# Patient Record
Sex: Female | Born: 1990 | Race: White | Hispanic: Yes | Marital: Single | Smoking: Current every day smoker
Health system: Southern US, Community
[De-identification: ages and names within clinical notes are randomized; demographics above are authoritative.]

## PROBLEM LIST (undated history)

## (undated) DIAGNOSIS — F419 Anxiety disorder, unspecified: Secondary | ICD-10-CM

## (undated) DIAGNOSIS — N750 Cyst of Bartholin's gland: Secondary | ICD-10-CM

## (undated) HISTORY — DX: Cyst of Bartholin's gland: N75.0

## (undated) HISTORY — PX: NO PAST SURGERIES: SHX2092

---

## 2009-05-11 ENCOUNTER — Ambulatory Visit: Payer: Self-pay | Admitting: Obstetrics and Gynecology

## 2009-05-11 LAB — CONVERTED CEMR LAB

## 2009-05-13 ENCOUNTER — Ambulatory Visit (HOSPITAL_COMMUNITY): Admission: RE | Admit: 2009-05-13 | Discharge: 2009-05-13 | Payer: Self-pay | Admitting: Family Medicine

## 2009-05-25 ENCOUNTER — Ambulatory Visit: Payer: Self-pay | Admitting: Obstetrics and Gynecology

## 2009-05-25 LAB — CONVERTED CEMR LAB
Chlamydia, DNA Probe: NEGATIVE
GC Probe Amp, Genital: NEGATIVE

## 2009-05-26 ENCOUNTER — Encounter: Payer: Self-pay | Admitting: Obstetrics and Gynecology

## 2009-06-01 ENCOUNTER — Ambulatory Visit: Payer: Self-pay | Admitting: Obstetrics and Gynecology

## 2009-06-08 ENCOUNTER — Ambulatory Visit: Payer: Self-pay | Admitting: Obstetrics and Gynecology

## 2009-06-15 ENCOUNTER — Ambulatory Visit: Payer: Self-pay | Admitting: Obstetrics and Gynecology

## 2009-06-22 ENCOUNTER — Ambulatory Visit: Payer: Self-pay | Admitting: Obstetrics and Gynecology

## 2009-06-22 LAB — CONVERTED CEMR LAB
ALT: 9 units/L (ref 0–35)
AST: 14 units/L (ref 0–37)
Albumin: 3.3 g/dL — ABNORMAL LOW (ref 3.5–5.2)
Alkaline Phosphatase: 148 units/L — ABNORMAL HIGH (ref 39–117)
Calcium: 9.3 mg/dL (ref 8.4–10.5)
Chloride: 107 meq/L (ref 96–112)
Glucose, Bld: 77 mg/dL (ref 70–99)
MCHC: 33.8 g/dL (ref 30.0–36.0)
Potassium: 3.6 meq/L (ref 3.5–5.3)
Total Protein: 6.3 g/dL (ref 6.0–8.3)
WBC: 9.7 10*3/uL (ref 4.0–10.5)

## 2009-06-23 ENCOUNTER — Inpatient Hospital Stay (HOSPITAL_COMMUNITY): Admission: AD | Admit: 2009-06-23 | Discharge: 2009-06-27 | Payer: Self-pay | Admitting: Obstetrics & Gynecology

## 2009-06-23 ENCOUNTER — Ambulatory Visit: Payer: Self-pay | Admitting: Advanced Practice Midwife

## 2009-06-23 ENCOUNTER — Ambulatory Visit: Payer: Self-pay | Admitting: Obstetrics and Gynecology

## 2009-06-28 ENCOUNTER — Ambulatory Visit: Payer: Self-pay | Admitting: Advanced Practice Midwife

## 2009-06-28 ENCOUNTER — Inpatient Hospital Stay (HOSPITAL_COMMUNITY): Admission: AD | Admit: 2009-06-28 | Discharge: 2009-06-28 | Payer: Self-pay | Admitting: Obstetrics & Gynecology

## 2009-07-01 ENCOUNTER — Ambulatory Visit: Payer: Self-pay | Admitting: Obstetrics & Gynecology

## 2009-07-03 ENCOUNTER — Ambulatory Visit: Payer: Self-pay | Admitting: Obstetrics and Gynecology

## 2009-07-03 ENCOUNTER — Inpatient Hospital Stay (HOSPITAL_COMMUNITY): Admission: AD | Admit: 2009-07-03 | Discharge: 2009-07-03 | Payer: Self-pay | Admitting: Obstetrics & Gynecology

## 2009-07-27 ENCOUNTER — Ambulatory Visit: Payer: Self-pay | Admitting: Obstetrics & Gynecology

## 2010-03-26 LAB — POCT URINALYSIS DIP (DEVICE)
Bilirubin Urine: NEGATIVE
Hgb urine dipstick: NEGATIVE
Ketones, ur: NEGATIVE mg/dL
Protein, ur: NEGATIVE mg/dL

## 2010-03-26 LAB — HERPES SIMPLEX VIRUS CULTURE

## 2010-03-26 LAB — URINE MICROSCOPIC-ADD ON

## 2010-03-26 LAB — CBC
HCT: 32.3 % — ABNORMAL LOW (ref 36.0–46.0)
HCT: 33 % — ABNORMAL LOW (ref 36.0–46.0)
HCT: 38.7 % (ref 36.0–46.0)
Hemoglobin: 12.5 g/dL (ref 12.0–15.0)
MCHC: 34.3 g/dL (ref 30.0–36.0)
MCHC: 34.5 g/dL (ref 30.0–36.0)
MCHC: 35 g/dL (ref 30.0–36.0)
MCV: 93.6 fL (ref 78.0–100.0)
MCV: 95 fL (ref 78.0–100.0)
Platelets: 270 10*3/uL (ref 150–400)
RBC: 2.99 MIL/uL — ABNORMAL LOW (ref 3.87–5.11)
RBC: 3.46 MIL/uL — ABNORMAL LOW (ref 3.87–5.11)
RDW: 12.9 % (ref 11.5–15.5)
WBC: 17.1 10*3/uL — ABNORMAL HIGH (ref 4.0–10.5)
WBC: 9.4 10*3/uL (ref 4.0–10.5)

## 2010-03-26 LAB — PROTEIN / CREATININE RATIO, URINE
Creatinine, Urine: 93.7 mg/dL
Creatinine, Urine: 93.8 mg/dL
Protein Creatinine Ratio: 0.21 — ABNORMAL HIGH (ref 0.00–0.15)
Total Protein, Urine: 20 mg/dL

## 2010-03-26 LAB — URINALYSIS, ROUTINE W REFLEX MICROSCOPIC
Bilirubin Urine: NEGATIVE
Protein, ur: 30 mg/dL — AB
Urobilinogen, UA: 0.2 mg/dL (ref 0.0–1.0)
pH: 6 (ref 5.0–8.0)

## 2010-03-26 LAB — COMPREHENSIVE METABOLIC PANEL
ALT: 9 U/L (ref 0–35)
AST: 25 U/L (ref 0–37)
Alkaline Phosphatase: 130 U/L — ABNORMAL HIGH (ref 39–117)
BUN: 10 mg/dL (ref 6–23)
Calcium: 9.4 mg/dL (ref 8.4–10.5)
GFR calc Af Amer: >60 mL/min (ref >60)
GFR calc non Af Amer: >60 mL/min (ref >60)
Glucose, Bld: 64 mg/dL — ABNORMAL LOW (ref 70–99)
Potassium: 3.3 mEq/L — ABNORMAL LOW (ref 3.5–5.1)
Total Bilirubin: 1.3 mg/dL — ABNORMAL HIGH (ref 0.3–1.2)
Total Protein: 5.9 g/dL — ABNORMAL LOW (ref 6.0–8.3)

## 2010-03-26 LAB — CULTURE, BLOOD (ROUTINE X 2): Culture: NO GROWTH

## 2010-03-26 LAB — WOUND CULTURE: Culture: NORMAL

## 2010-03-27 LAB — POCT URINALYSIS DIP (DEVICE)
Glucose, UA: NEGATIVE mg/dL
Glucose, UA: NEGATIVE mg/dL
Glucose, UA: NEGATIVE mg/dL
Ketones, ur: NEGATIVE mg/dL
Ketones, ur: NEGATIVE mg/dL
Nitrite: NEGATIVE
Nitrite: NEGATIVE
Protein, ur: NEGATIVE mg/dL
Protein, ur: NEGATIVE mg/dL
Specific Gravity, Urine: 1.01 (ref 1.005–1.030)
Specific Gravity, Urine: 1.01 (ref 1.005–1.030)
Specific Gravity, Urine: 1.015 (ref 1.005–1.030)
Urobilinogen, UA: 0.2 mg/dL (ref 0.0–1.0)
pH: 6.5 (ref 5.0–8.0)
pH: 7 (ref 5.0–8.0)

## 2010-03-28 LAB — POCT URINALYSIS DIP (DEVICE)
Ketones, ur: NEGATIVE mg/dL
Protein, ur: NEGATIVE mg/dL
Specific Gravity, Urine: 1.015 (ref 1.005–1.030)
pH: 6.5 (ref 5.0–8.0)

## 2011-01-30 ENCOUNTER — Encounter (HOSPITAL_COMMUNITY): Payer: Self-pay

## 2011-01-30 ENCOUNTER — Emergency Department (HOSPITAL_COMMUNITY)
Admission: EM | Admit: 2011-01-30 | Discharge: 2011-01-30 | Disposition: A | Discharge status: Home / Self Care | Payer: Self-pay | Attending: Emergency Medicine | Admitting: Emergency Medicine

## 2011-01-30 DIAGNOSIS — N751 Abscess of Bartholin's gland: Secondary | ICD-10-CM | POA: Insufficient documentation

## 2011-01-30 DIAGNOSIS — N898 Other specified noninflammatory disorders of vagina: Secondary | ICD-10-CM | POA: Insufficient documentation

## 2011-01-30 LAB — WET PREP, GENITAL: Yeast Wet Prep HPF POC: NONE SEEN

## 2011-01-30 MED ORDER — CEFIXIME 400 MG PO TABS: 400.0000 mg | ORAL_TABLET | Freq: Every day | ORAL | Status: AC

## 2011-01-30 MED ORDER — IBUPROFEN 800 MG PO TABS: 800.0000 mg | ORAL_TABLET | Freq: Three times a day (TID) | ORAL | Status: AC | PRN

## 2011-01-30 MED ORDER — LIDOCAINE HCL 2 % IJ SOLN
10.0000 mL | Freq: Once | INTRAMUSCULAR | Status: AC
  Administered 2011-01-30: 200 mg
  Filled 2011-01-30: qty 1

## 2011-01-30 NOTE — ED Notes (Signed)
Patient reports that she has raised area possible abcess at vagina. Denies pain, no drainage

## 2011-01-30 NOTE — ED Notes (Signed)
Pt given discharge info and rx, verb understanding, amb indep to discharge window.

## 2011-01-30 NOTE — ED Provider Notes (Signed)
Medical screening examination/treatment/procedure(s) were performed by non-physician practitioner and as supervising physician I was immediately available for consultation/collaboration.  Doug Sou, MD 01/30/11 [phone]

## 2011-01-30 NOTE — ED Provider Notes (Signed)
History     CSN: [id number]  Arrival date & time 01/30/11  1406   First MD Initiated Contact with Patient 01/30/11 1445      Chief Complaint  Patient presents with  . Cyst    pt states non tender ball protruding from vaginal canal    (Consider location/radiation/quality/duration/timing/severity/associated sxs/prior treatment) HPI Comments: Patient reports a "big red ball" protruding from her vagina x several months.  States it does not hurt except after sex.  Denies fevers, abdominal pain, abnormal vaginal discharge or bleeding, dysuria, urinary frequency, urgency, or incomplete emptying, change in bowel habits including diarrhea, hematochezia or melena.  Patient has IUD in place, LMP 18 months ago, prior to IUD placement.    The history is provided by the patient.    History reviewed. No pertinent past medical history.  History reviewed. No pertinent past surgical history.  No family history on file.  History  Substance Use Topics  . Smoking status: Current Everyday Smoker -- 0.5 packs/day  . Smokeless tobacco: Not on file  . Alcohol Use: No    OB History    Grav Para Term Preterm Abortions TAB SAB Ect Mult Living                  Review of Systems  Constitutional: Negative for fever.  Respiratory: Negative for shortness of breath.   Cardiovascular: Negative for chest pain.  Gastrointestinal: Negative for nausea, vomiting, abdominal pain, diarrhea, constipation, blood in stool, abdominal distention, anal bleeding and rectal pain.  Genitourinary: Negative for dysuria, urgency, frequency, hematuria, flank pain, decreased urine volume, vaginal bleeding, vaginal discharge, difficulty urinating, menstrual problem and pelvic pain.  All other systems reviewed and are negative.    Allergies  Review of patient's allergies indicates no known allergies.  Home Medications   Current Outpatient Rx  Name Route Sig Dispense Refill  . VITAMIN B-12 IJ Injection Inject 1  Syringe as directed once.    Marland Kitchen PHENTERMINE HCL 37.5 MG PO CAPS Oral Take 37.5 mg by mouth every morning.      BP 131/72  Pulse 81  Temp(Src) 98.9 F (37.2 C) (Oral)  Resp 16  Wt 197 lb (89.359 kg)  SpO2 100%  Physical Exam  Nursing note and vitals reviewed. Constitutional: She is oriented to person, place, and time. She appears well-developed and well-nourished.  HENT:  Head: Normocephalic and atraumatic.  Neck: Neck supple.  Cardiovascular: Normal rate and regular rhythm.   Pulmonary/Chest: Effort normal and breath sounds normal.  Abdominal: Soft. She exhibits no distension. There is no tenderness. There is no rebound and no guarding.  Genitourinary:    No tenderness around the vagina.       No rectocele or cystocele.  Small amount of white discharge.  Unable to fully visualize cervix due to large bartholin gland swelling.    Neurological: She is alert and oriented to person, place, and time.    ED Course  Procedures (including critical care time)  Labs Reviewed  WET PREP, GENITAL - Abnormal; Notable for the following:    Clue Cells, Wet Prep FEW (*)    WBC, Wet Prep HPF POC FEW (*)    All other components within normal limits  GC/CHLAMYDIA PROBE AMP, GENITAL   No results found.  Pelvic exam performed to further evaluate area of swelling, which patient thought was coming from her posterior vaginal wall - swelling actually likely bartholin's gland abscess - no cystocele or rectocele present.  Unable to fully complete  pelvic exam due to large swelling at introitus - vaginal mucosa is healthy-appearing.   INCISION AND DRAINAGE Performed by: Rise Patience Consent: Verbal consent obtained. Risks and benefits: risks, benefits and alternatives were discussed Type: abscess  Body area: right bartholin gland  Anesthesia: local infiltration  Local anesthetic: lidocaine 2% NO epinephrine  Anesthetic total: 3 ml  Complexity: complex Blunt dissection to break up  loculations  Drainage: purulent  Drainage amount: large amount of thin grey purulent fluid  Packing material: none - sac completely collapsed after I&D  Patient tolerance: Patient tolerated the procedure well with no immediate complications.    .  1. Bartholin's gland abscess       MDM  Patient with tender mass right introitus, c/w bartholin gland abscess.  I&D performed in ED with purulent drainage.  Sac drained down until completely deflated, did not place packing.  No surround erythema or induration.  No fevers.  As I was not able to pack area, patient placed on antibiotics and given instructions for warm soaks, [facility]  Pt verbalizes understanding and agrees with plan.           Dillard Cannon [place], [place] 01/30/11 [phone]

## 2011-01-31 LAB — GC/CHLAMYDIA PROBE AMP, GENITAL: GC Probe Amp, Genital: NEGATIVE

## 2011-04-09 DIAGNOSIS — N750 Cyst of Bartholin's gland: Secondary | ICD-10-CM

## 2011-04-09 HISTORY — DX: Cyst of Bartholin's gland: N75.0

## 2011-07-18 ENCOUNTER — Ambulatory Visit (INDEPENDENT_AMBULATORY_CARE_PROVIDER_SITE_OTHER): Payer: Self-pay | Admitting: Family Medicine

## 2011-07-18 ENCOUNTER — Encounter: Payer: Self-pay | Admitting: Family Medicine

## 2011-07-18 VITALS — BP 121/67 | HR 83 | Temp 98.4°F | Ht 66.0 in | Wt 184.0 lb

## 2011-07-18 DIAGNOSIS — N898 Other specified noninflammatory disorders of vagina: Secondary | ICD-10-CM | POA: Insufficient documentation

## 2011-07-18 DIAGNOSIS — M549 Dorsalgia, unspecified: Secondary | ICD-10-CM | POA: Insufficient documentation

## 2011-07-18 DIAGNOSIS — N750 Cyst of Bartholin's gland: Secondary | ICD-10-CM

## 2011-07-18 DIAGNOSIS — K089 Disorder of teeth and supporting structures, unspecified: Secondary | ICD-10-CM

## 2011-07-18 DIAGNOSIS — H547 Unspecified visual loss: Secondary | ICD-10-CM
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
## 2011-07-18 NOTE — Patient Instructions (Addendum)
Follow-up Friday or whenever it is convenient for you.

## 2011-07-20 ENCOUNTER — Ambulatory Visit (HOSPITAL_COMMUNITY)
Admission: RE | Admit: 2011-07-20 | Discharge: 2011-07-20 | Disposition: A | Discharge status: Home / Self Care | Payer: Self-pay | Source: Ambulatory Visit | Attending: Family Medicine | Admitting: Family Medicine

## 2011-07-20 ENCOUNTER — Ambulatory Visit (INDEPENDENT_AMBULATORY_CARE_PROVIDER_SITE_OTHER): Payer: Self-pay | Admitting: Family Medicine

## 2011-07-20 ENCOUNTER — Encounter: Payer: Self-pay | Admitting: Family Medicine

## 2011-07-20 VITALS — BP 110/70 | Ht 65.0 in | Wt 183.5 lb

## 2011-07-20 DIAGNOSIS — M545 Low back pain, unspecified: Secondary | ICD-10-CM | POA: Insufficient documentation

## 2011-07-20 DIAGNOSIS — M549 Dorsalgia, unspecified: Secondary | ICD-10-CM

## 2011-07-20 MED ORDER — CYCLOBENZAPRINE HCL 5 MG PO TABS: 5.0000 mg | ORAL_TABLET | Freq: Three times a day (TID) | ORAL | Status: DC | PRN

## 2011-07-20 NOTE — Patient Instructions (Addendum)
Go to [facility] and get x-rays of your back Follow-up in 1 week  Try the flexeril. 1 tablet in the morning and 1-2 tablets other times during the day as needed for spasms. Warm compresses Try ibuprofen 800 mg every 8 hours as needed for pain. Take with food to avoid nausea.

## 2011-07-20 NOTE — Assessment & Plan Note (Signed)
Will check x-rays of lumbar spine to evaluate for potential herniation, bony pathology.  For muscle spasm, flexeril and warm compresses.  If x-rays negative, consider referral to PT which patient found helpful in past.
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
## 2011-07-21 ENCOUNTER — Telehealth: Payer: Self-pay | Admitting: Family Medicine

## 2011-07-21 DIAGNOSIS — M62830 Muscle spasm of back: Secondary | ICD-10-CM

## 2011-07-21 NOTE — Telephone Encounter (Signed)
Discussed x-ray results with patient showing possible narrowing between L4-L5. She does not have signs of radiculopathy.  I think muscle spasms is contributing more to her discomfort than possible disc disease.  -Will continue muscle relaxants.  -Will refer to PT.  -Follow-up in 1 month (after PT) to re-evaluate.

## 2011-07-27 ENCOUNTER — Ambulatory Visit: Payer: Self-pay | Admitting: Family Medicine

## 2011-08-03 ENCOUNTER — Ambulatory Visit (INDEPENDENT_AMBULATORY_CARE_PROVIDER_SITE_OTHER): Payer: Self-pay | Admitting: Family Medicine

## 2011-08-03 VITALS — BP 109/70 | HR 90 | Temp 98.2°F | Ht 65.0 in | Wt 179.3 lb

## 2011-08-03 DIAGNOSIS — K029 Dental caries, unspecified: Secondary | ICD-10-CM | POA: Insufficient documentation

## 2011-08-03 DIAGNOSIS — M549 Dorsalgia, unspecified: Secondary | ICD-10-CM

## 2011-08-03 MED ORDER — METHOCARBAMOL 500 MG PO TABS: 1000.0000 mg | ORAL_TABLET | Freq: Three times a day (TID) | ORAL | Status: AC | PRN

## 2011-08-03 MED ORDER — MELOXICAM 15 MG PO TABS: 15.0000 mg | ORAL_TABLET | Freq: Every day | ORAL | Status: DC

## 2011-08-03 NOTE — Patient Instructions (Addendum)
Avenly,  Thank you for coming in today. For your back pain: 1. mobic daily 2. Robaxin 2 tab up to 3 x daily  3. PT   Please schedule an appt with Dr. Bluford Kaufmann park to discuss smoking cessation and Bartholin gland cyst  Dr. Armen Pickup

## 2011-08-03 NOTE — Assessment & Plan Note (Signed)
Cavities w/o abscess. Referral to dentist made.
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
## 2011-08-03 NOTE — Assessment & Plan Note (Signed)
A: chronic low back pain.  P: Change flexeril to robaxin.  Start mobic. Keep f/u with PT.

## 2011-08-06 ENCOUNTER — Ambulatory Visit: Payer: Self-pay | Attending: Family Medicine | Admitting: Physical Therapy

## 2011-08-06 DIAGNOSIS — IMO0001 Reserved for inherently not codable concepts without codable children: Secondary | ICD-10-CM | POA: Insufficient documentation

## 2011-08-06 DIAGNOSIS — M545 Low back pain, unspecified: Secondary | ICD-10-CM | POA: Insufficient documentation

## 2011-08-06 DIAGNOSIS — M2569 Stiffness of other specified joint, not elsewhere classified: Secondary | ICD-10-CM | POA: Insufficient documentation

## 2011-08-09 ENCOUNTER — Ambulatory Visit: Payer: Self-pay | Admitting: Physical Therapy

## 2011-08-14 ENCOUNTER — Ambulatory Visit: Payer: Self-pay | Attending: Family Medicine | Admitting: Physical Therapy

## 2011-08-14 DIAGNOSIS — M545 Low back pain, unspecified: Secondary | ICD-10-CM | POA: Insufficient documentation

## 2011-08-14 DIAGNOSIS — IMO0001 Reserved for inherently not codable concepts without codable children: Secondary | ICD-10-CM | POA: Insufficient documentation

## 2011-08-14 DIAGNOSIS — M2569 Stiffness of other specified joint, not elsewhere classified: Secondary | ICD-10-CM | POA: Insufficient documentation

## 2011-08-17 ENCOUNTER — Ambulatory Visit: Payer: Self-pay | Admitting: Physical Therapy

## 2011-08-21 ENCOUNTER — Ambulatory Visit: Payer: Self-pay | Admitting: Physical Therapy

## 2011-08-24 ENCOUNTER — Ambulatory Visit: Payer: Self-pay | Admitting: Physical Therapy

## 2011-08-30 ENCOUNTER — Ambulatory Visit (INDEPENDENT_AMBULATORY_CARE_PROVIDER_SITE_OTHER): Payer: Self-pay | Admitting: Obstetrics & Gynecology

## 2011-08-30 ENCOUNTER — Encounter: Payer: Self-pay | Admitting: Advanced Practice Midwife

## 2011-08-30 ENCOUNTER — Telehealth: Payer: Self-pay | Admitting: Family Medicine

## 2011-08-30 VITALS — BP 128/80 | HR 84 | Temp 99.5°F | Ht 65.0 in | Wt 179.4 lb

## 2011-08-30 DIAGNOSIS — N898 Other specified noninflammatory disorders of vagina: Secondary | ICD-10-CM

## 2011-08-30 NOTE — Telephone Encounter (Signed)
FYI to MD, notes in epic, appointment was scheduled for patient at [facility] prior to patient leaving GYN clinic, patient aware.

## 2011-08-30 NOTE — Progress Notes (Signed)
Patient ID: Amanda Villarreal, female   DOB: [birth year], 21 y.o.   MRN: [id number] Chief Complaint   Patient presents with   .  Cyst     pt states non tender ball protruding from vaginal canal   (Consider location/radiation/quality/duration/timing/severity/associated sxs/prior  treatment)  HPI Comments:Patient's last menstrual period was 08/29/2011. G2P1010 And an in and and Patient reports a "big red ball" protruding from her vagina x several months. States it does not hurt except after sex.I and D done at [facility] ED 01/30/11, sx recurred in 2 weeks  Denies fevers, abdominal pain, abnormal vaginal discharge or bleeding, dysuria, urinary frequency, urgency, or incomplete emptying, change in bowel habits including diarrhea, hematochezia or melena. Patient has IUD in place, LMP 821, light, IUD in place 24 mo  The history is provided by the patient.  History reviewed. No pertinent past medical history.  History reviewed. No pertinent past surgical history.  No family history on file.  History   Substance Use Topics   .  Smoking status:  Current Everyday Smoker -- 0.5 packs/day   .  Smokeless tobacco:  Not on file   .  Alcohol Use:  No    OB History    Grav  Para  Term  Preterm  Abortions  TAB  SAB  Ect  Mult  Living                 Review of Systems  Constitutional: Negative for fever.  Respiratory: Negative for shortness of breath.  Cardiovascular: Negative for chest pain.  Gastrointestinal: Negative for nausea, vomiting, abdominal pain, diarrhea, constipation, blood in stool, abdominal distention, anal bleeding and rectal pain.  Genitourinary: Negative for dysuria, urgency, frequency, hematuria, flank pain, decreased urine volume, vaginal bleeding, vaginal discharge, difficulty urinating, menstrual problem and pelvic pain.  All other systems reviewed and are negative.  Allergies   Review of patient's allergies indicates no known allergies.  Home Medications    Current Outpatient Rx   Name   Route  Sig  Dispense  Refill   .  VITAMIN B-12 IJ  Injection  Inject 1 Syringe as directed once.     Marland Kitchen  PHENTERMINE HCL 37.5 MG PO CAPS  Oral  Take 37.5 mg by mouth every morning.      Filed Vitals:   08/30/11 1305  BP: 128/80  Pulse: 84  Temp: 99.5 F (37.5 C)    Physical Exam  Nursing note and vitals reviewed.  Constitutional: She is oriented to person, place, and time. She appears well-developed and well-nourished.  HENT:  Head: Normocephalic and atraumatic.  Neck: Neck supple.  .  Pulmonary/Chest: Effort normal and breath sounds normal.  Abdominal: Soft. She exhibits no distension. There is no tenderness. There is no rebound and no guarding.  Genitourinary: 2 cm suburethral vaginal cyst visible immediately inferior to the meatus mildly tender. Pink mucosa, smooth Impression: vaginal wall cyst, suspicious for urethral involvement, not Bartholin's  Gland  Urology referral to assess for possible urethral diverticulum  Amanda Villarreal 2:12 PM 08/30/2011

## 2011-08-30 NOTE — Progress Notes (Signed)
Scheduled [facility] for September 24 @ 3pm.  Pt is aware of $250 payment at time of appt.

## 2011-08-30 NOTE — Telephone Encounter (Signed)
Patient is calling because she went to the GYN today and they have said she needs a referral to a Urologist.

## 2011-08-31 ENCOUNTER — Ambulatory Visit: Payer: Self-pay | Admitting: Physical Therapy

## 2011-09-11 ENCOUNTER — Encounter: Payer: Self-pay | Admitting: Family Medicine

## 2011-09-11 ENCOUNTER — Ambulatory Visit (INDEPENDENT_AMBULATORY_CARE_PROVIDER_SITE_OTHER): Payer: Self-pay | Admitting: Family Medicine

## 2011-09-11 VITALS — BP 121/83 | HR 92 | Temp 98.8°F | Ht 65.0 in | Wt 178.0 lb

## 2011-09-11 DIAGNOSIS — N898 Other specified noninflammatory disorders of vagina: Secondary | ICD-10-CM

## 2011-09-11 DIAGNOSIS — F411 Generalized anxiety disorder: Secondary | ICD-10-CM

## 2011-09-11 DIAGNOSIS — K029 Dental caries, unspecified: Secondary | ICD-10-CM

## 2011-09-11 DIAGNOSIS — M549 Dorsalgia, unspecified: Secondary | ICD-10-CM

## 2011-09-11 MED ORDER — PAROXETINE HCL 10 MG PO TABS
ORAL_TABLET | ORAL | Status: DC
Start: 2011-09-11 — End: 2011-10-24

## 2011-09-11 MED ORDER — ALPRAZOLAM 0.5 MG PO TBDP: 0.5000 mg | ORAL_TABLET | Freq: Two times a day (BID) | ORAL | Status: AC | PRN

## 2011-09-11 NOTE — Assessment & Plan Note (Signed)
-  Will try Effexor to help stabilize mood. Will give limited 2-weeks worth of alprazolam while she bridges to Effexor. It was made explicit this medication is only to be used short-term. She used it infrequently in the past when diagnosed with panic attacks at age 21. -I think she would benefit from CBT to learn techniques to deal with stressors. Given numbers for possible resources to try.  -Follow-up in 2 weeks.

## 2011-09-11 NOTE — Assessment & Plan Note (Signed)
Documentation only. Improved with physical therapy exercises.

## 2011-09-11 NOTE — Assessment & Plan Note (Signed)
Dr. Debroah Loop (obstetrician) who evaluated patient did not think cyst was due to Bartholin's gland. He is concerned about urethral diverticulum.  -Will refer to urology through [facility]

## 2011-09-11 NOTE — Progress Notes (Signed)
  Subjective:    Patient ID: Amanda Villarreal, female    DOB: [birth year], 21 y.o.   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 They improved but her anxiety is becoming worse and she is starting to have mild panic attacks again.  They happen during socially stressful situations, such as speaking in front of groups of people. The hair stands up on the back of her head and she becomes flushed.  She is also finding her mood to be increasing irritable, especially with her boyfriend.   # Back pain It is improving with physical therapy.   Review of Systems Per HPI. Denies depression  Allergies, medication, past medical history reviewed.      Objective:   Physical Exam GEN: NAD; well-nourished, -appearing PSYCH: appears anxious and with occasional pressured speech; engaged; appropriate to questions; does not appear depressed    Assessment & Plan:

## 2011-09-11 NOTE — Patient Instructions (Addendum)
For your anxiety: -Start Paxil, take as directed -Follow-up in 2 weeks -To help with anxiety during the first 2 weeks on Paxil, you may take alprazolam up to 1 tablet every 12 hours  I would recommend behavioral modification therapy Try calling the following clinics:   [facility] [place] [phone]  [facility] [phone]  [facility] [phone]   We will refer you to a urologist

## 2011-09-19 ENCOUNTER — Telehealth: Payer: Self-pay | Admitting: Family Medicine

## 2011-09-19 NOTE — Telephone Encounter (Signed)
Pt has an appt on 9/24 at [facility] set up by [facility].  Wants to know if this is covered by the orange card.  She tried to call there, but the person she needed to speak with was out so she called here to find out.

## 2011-09-19 NOTE — Telephone Encounter (Signed)
Contacted [employer] since we have not heard back from them, they state that yes it is covered they just have not sent out the info to our office yet.  Informed pt.  She will call back to let us know the date and time so that we can put it in the system. Caragh Gasper, Maryjo [place]

## 2011-09-26 ENCOUNTER — Other Ambulatory Visit (HOSPITAL_COMMUNITY): Payer: Self-pay | Admitting: Urology

## 2011-09-26 DIAGNOSIS — N361 Urethral diverticulum: Secondary | ICD-10-CM

## 2011-09-27 ENCOUNTER — Ambulatory Visit (HOSPITAL_COMMUNITY): Admission: RE | Admit: 2011-09-27 | Payer: Self-pay | Source: Ambulatory Visit

## 2011-09-29 ENCOUNTER — Ambulatory Visit (HOSPITAL_COMMUNITY)
Admission: RE | Admit: 2011-09-29 | Discharge: 2011-09-29 | Disposition: A | Discharge status: Home / Self Care | Payer: Self-pay | Source: Ambulatory Visit | Attending: Urology | Admitting: Urology

## 2011-09-29 DIAGNOSIS — N361 Urethral diverticulum: Secondary | ICD-10-CM

## 2011-09-29 DIAGNOSIS — N949 Unspecified condition associated with female genital organs and menstrual cycle: Secondary | ICD-10-CM | POA: Insufficient documentation

## 2011-09-29 DIAGNOSIS — N72 Inflammatory disease of cervix uteri: Secondary | ICD-10-CM | POA: Insufficient documentation

## 2011-09-29 MED ORDER — GADOBENATE DIMEGLUMINE 529 MG/ML IV SOLN
15.0000 mL | Freq: Once | INTRAVENOUS | Status: AC | PRN
  Administered 2011-09-29: 15 mL via INTRAVENOUS

## 2011-10-16 ENCOUNTER — Encounter: Payer: Self-pay | Admitting: Family Medicine

## 2011-10-16 ENCOUNTER — Other Ambulatory Visit: Payer: Self-pay | Admitting: Urology

## 2011-10-16 NOTE — Telephone Encounter (Signed)
error 

## 2011-10-17 NOTE — Telephone Encounter (Signed)
This encounter was created in error - please disregard.

## 2011-10-23 ENCOUNTER — Telehealth: Payer: Self-pay | Admitting: Family Medicine

## 2011-10-23 NOTE — Telephone Encounter (Signed)
Pt states that she is taking 2 per day of the Paxil

## 2011-10-23 NOTE — Telephone Encounter (Signed)
Patient asked to let me know how many Paxil 20 mg tablets she is taking now so refill request can be faxed.

## 2011-10-24 MED ORDER — PAROXETINE HCL 40 MG PO TABS: 40.0000 mg | ORAL_TABLET | ORAL | Status: DC

## 2011-10-24 NOTE — Telephone Encounter (Signed)
Rx faxed

## 2011-10-24 NOTE — Addendum Note (Signed)
Addended by: Priscella Mann J on: 10/24/2011 12:39 PM   Modules accepted: Orders

## 2011-10-31 ENCOUNTER — Encounter: Payer: Self-pay | Admitting: Family Medicine

## 2011-10-31 ENCOUNTER — Ambulatory Visit (INDEPENDENT_AMBULATORY_CARE_PROVIDER_SITE_OTHER): Payer: Self-pay | Admitting: Family Medicine

## 2011-10-31 VITALS — BP 119/76 | HR 70 | Temp 98.1°F | Ht 65.0 in | Wt 187.0 lb

## 2011-10-31 DIAGNOSIS — G4700 Insomnia, unspecified: Secondary | ICD-10-CM | POA: Insufficient documentation

## 2011-10-31 DIAGNOSIS — Z111 Encounter for screening for respiratory tuberculosis: Secondary | ICD-10-CM

## 2011-10-31 DIAGNOSIS — F411 Generalized anxiety disorder: Secondary | ICD-10-CM

## 2011-10-31 DIAGNOSIS — Z23 Encounter for immunization: Secondary | ICD-10-CM

## 2011-10-31 DIAGNOSIS — N898 Other specified noninflammatory disorders of vagina: Secondary | ICD-10-CM

## 2011-10-31 MED ORDER — MELATONIN 1 MG PO TABS: 0.5000 mg | ORAL_TABLET | Freq: Every evening | ORAL | Status: DC | PRN

## 2011-10-31 MED ORDER — PAROXETINE HCL 30 MG PO TABS: 30.0000 mg | ORAL_TABLET | ORAL | Status: DC

## 2011-10-31 NOTE — Assessment & Plan Note (Signed)
Try melatonin. Follow-up in 2 weeks if not helping. Anxiety symptoms seem to be contributing significantly.

## 2011-10-31 NOTE — Assessment & Plan Note (Signed)
Doing well on Paxil 30 mg (40 mg was too much). Continue. Follow-up in 3 months.

## 2011-10-31 NOTE — Patient Instructions (Addendum)
Try melatonin. 1/2 tablet before bedtime for a week. If you are still having difficulty sleeping, take 1 full tablet  Continue Paxil at 30 mg  Follow-up in 2 weeks if you are still having difficulty sleeping. If you are doing well, follow-up in 3 months.

## 2011-10-31 NOTE — Progress Notes (Signed)
  Subjective:    Patient ID: Amanda Villarreal, female    DOB: 1990/12/27, 21 y.o.   MRN: [id number]  HPI # GAD Well controlled with Paxil 30 mg It has been much easier to focus at school and she feels less anxious and more energetic during day after taking Paxil in the morning   # Insomnia She feels like she cannot relax This has not worsened after starting Paxil Lorazepam and alprazolam help but make her feel groggy in the morning  She reports good sleep hygiene (turns down everything at home), but ends up laying in bed for a couple of hours and then having to get up and move  Review of Systems Denies chest pain Denies depression  Allergies, medication, past medical history reviewed.      Objective:   Physical Exam GEN: NAD; well-appearing, -nourished; overweight PSYCH: appears mildly anxious; normal affect; smiling CV: RRR, normal S1/S2, no murmurs/gallops NEURO: intact    Assessment & Plan:

## 2011-10-31 NOTE — Assessment & Plan Note (Signed)
Documentation only. Seen by urologist who has her scheduled for surgery end of November. She is not sure what her diagnosis is.

## 2011-11-18 ENCOUNTER — Emergency Department (HOSPITAL_COMMUNITY)
Admission: EM | Admit: 2011-11-18 | Discharge: 2011-11-18 | Disposition: A | Discharge status: Home / Self Care | Payer: Self-pay | Attending: Emergency Medicine | Admitting: Emergency Medicine

## 2011-11-18 ENCOUNTER — Encounter (HOSPITAL_COMMUNITY): Payer: Self-pay | Admitting: *Deleted

## 2011-11-18 DIAGNOSIS — Z79899 Other long term (current) drug therapy: Secondary | ICD-10-CM | POA: Insufficient documentation

## 2011-11-18 DIAGNOSIS — K0889 Other specified disorders of teeth and supporting structures: Secondary | ICD-10-CM

## 2011-11-18 DIAGNOSIS — K089 Disorder of teeth and supporting structures, unspecified: Secondary | ICD-10-CM | POA: Insufficient documentation

## 2011-11-18 DIAGNOSIS — F17200 Nicotine dependence, unspecified, uncomplicated: Secondary | ICD-10-CM | POA: Insufficient documentation

## 2011-11-18 DIAGNOSIS — Z8742 Personal history of other diseases of the female genital tract: Secondary | ICD-10-CM | POA: Insufficient documentation

## 2011-11-18 MED ORDER — PENICILLIN V POTASSIUM 500 MG PO TABS: 500.0000 mg | ORAL_TABLET | Freq: Four times a day (QID) | ORAL | Status: DC

## 2011-11-18 MED ORDER — HYDROCODONE-ACETAMINOPHEN 5-325 MG PO TABS: 2.0000 | ORAL_TABLET | ORAL | Status: DC | PRN

## 2011-11-18 MED ORDER — OXYCODONE-ACETAMINOPHEN 5-325 MG PO TABS
2.0000 | ORAL_TABLET | Freq: Once | ORAL | Status: AC
  Administered 2011-11-18: 2 via ORAL
  Filled 2011-11-18: qty 2

## 2011-11-18 NOTE — ED Provider Notes (Signed)
Medical screening examination/treatment/procedure(s) were performed by non-physician practitioner and as supervising physician I was immediately available for consultation/collaboration.    Nelia Shi, MD 11/18/11 2129

## 2011-11-18 NOTE — ED Notes (Signed)
Patient given discharge instructions, information, prescriptions, and diet order. Patient states that they adequately understand discharge information given and to return to ED if symptoms return or worsen.     

## 2011-11-18 NOTE — ED Provider Notes (Signed)
History     CSN: [id number]  Arrival date & time 11/18/11  2006   First MD Initiated Contact with Patient 11/18/11 2036      Chief Complaint  Patient presents with  . Dental Pain    (Consider location/radiation/quality/duration/timing/severity/associated sxs/prior treatment) HPI Comments: This is a 21 year old female, who presents to the emergency department with chief complaint of dental pain x3 days. She states that she has been appointment to see her dentist on November 19, but cannot go any longer without pain medicine. She took a Vicodin from her mother this morning which helped, and she is requesting more pain medicine. She states the pain is 10 out of 10. She denies fever. Denies nausea or vomiting, denies shortness of breath and chest pain. She states that she has pain in her jaw. Her symptoms have been getting worse over the past several days.  The history is provided by the patient. No language interpreter was used.    Past Medical History  Diagnosis Date  . Bartholin's gland cyst 04/2011    History reviewed. No pertinent past surgical history.  Family History  Problem Relation Age of Onset  . Hypertension Mother   . Diabetes Mother   . Fibromyalgia Mother   . Arthritis Mother   . Nephrolithiasis Mother   . Lung cancer Maternal Grandfather     History  Substance Use Topics  . Smoking status: Current Every Day Smoker -- 0.5 packs/day for 9 years  . Smokeless tobacco: Not on file  . Alcohol Use: No    OB History    Grav Para Term Preterm Abortions TAB SAB Ect Mult Living   2 1 1  1            Review of Systems  All other systems reviewed and are negative.    Allergies  Review of patient's allergies indicates no known allergies.  Home Medications   Current Outpatient Rx  Name  Route  Sig  Dispense  Refill  . PAROXETINE HCL 30 MG PO TABS   Oral   Take 1 tablet (30 mg total) by mouth every morning.   30 tablet   3   . HYDROCODONE-ACETAMINOPHEN  5-325 MG PO TABS   Oral   Take 2 tablets by mouth every 4 (four) hours as needed for pain.   6 tablet   0   . PENICILLIN V POTASSIUM 500 MG PO TABS   Oral   Take 1 tablet (500 mg total) by mouth 4 (four) times daily.   40 tablet   0     BP 118/84  Pulse 65  Temp 98.4 F (36.9 C) (Oral)  Resp 20  Ht 5\' 5"  (1.651 m)  Wt 183 lb (83.008 kg)  BMI 30.45 kg/m2  SpO2 100%  LMP 10/28/2011  Physical Exam  Nursing note and vitals reviewed. Constitutional: She is oriented to person, place, and time. She appears well-developed and well-nourished.  HENT:  Head: Normocephalic and atraumatic.       Bottom left molar cracked, with surrounding erythema and swelling. Poor dentition throughout.  Eyes: Conjunctivae normal and EOM are normal. Pupils are equal, round, and reactive to light.  Neck: Normal range of motion. Neck supple.  Cardiovascular: Normal rate and regular rhythm.  Exam reveals no gallop and no friction rub.   No murmur heard. Pulmonary/Chest: Effort normal and breath sounds normal. No respiratory distress. She has no wheezes. She has no rales. She exhibits no tenderness.  Abdominal: Soft. Bowel sounds  are normal. She exhibits no distension and no mass. There is no tenderness. There is no rebound and no guarding.  Musculoskeletal: Normal range of motion. She exhibits no edema and no tenderness.  Neurological: She is alert and oriented to person, place, and time.  Skin: Skin is warm and dry.  Psychiatric: She has a normal mood and affect. Her behavior is normal. Judgment and thought content normal.    ED Course  Procedures (including critical care time)  Labs Reviewed - No data to display No results found.   1. Pain, dental       MDM  21 year old female with dental pain. I'm going to prescribe the patient Norco and penicillin. Loss manage the patient's pain with Percocet while in the emergency department. She is not driving home. Patient will followup with her  dentist tomorrow. She is agreeable with this plan. Advised warm compresses. Patient is stable and ready for discharge.        Roxy Horseman, PA-C 11/18/11 2125

## 2011-11-18 NOTE — ED Notes (Signed)
Pt from home, alert and oriented. Pt sts she has been dealing with a painful tooth for a while now and that three days ago the pain got much worse. Patient tried an OTC cap from [facility] with slight relief 3 days ago- pt unable to press cap into her tooth now because the pain is so great. Pt feels pain throughout her left cheek.

## 2011-11-20 ENCOUNTER — Telehealth: Payer: Self-pay | Admitting: Family Medicine

## 2011-11-20 MED ORDER — HYDROCODONE-ACETAMINOPHEN 5-325 MG PO TABS: 2.0000 | ORAL_TABLET | Freq: Two times a day (BID) | ORAL | Status: DC | PRN

## 2011-11-20 NOTE — Telephone Encounter (Signed)
Attempted call back and after  Dialing area code busy signal starts. Will try again later.

## 2011-11-20 NOTE — Telephone Encounter (Signed)
Spoke with patient and she has appointment with dentist on Thursday of this week . Went to ED and is on  Penicillin. Was given  just a few pain tabs .  Wants to know if Dr. Madolyn Frieze will prescribe pain medication now. Spoke with Dr. Madolyn Frieze and she will call patient .

## 2011-11-20 NOTE — Telephone Encounter (Addendum)
Attempted calling back.  Message left on voicemail to return call.

## 2011-11-20 NOTE — Telephone Encounter (Signed)
Patient is calling saying this is an emergency that she speak to Dr. Madolyn Frieze.  This is pertaining to the dental procedure and the extreme pain she is in.  It has gotten so bad that it looks like she has a golf ball size swelling in her mouth.  She would like something for pain.

## 2011-11-27 ENCOUNTER — Encounter (HOSPITAL_COMMUNITY): Payer: Self-pay | Admitting: Pharmacist

## 2011-11-30 ENCOUNTER — Encounter (HOSPITAL_COMMUNITY)
Admission: RE | Admit: 2011-11-30 | Discharge: 2011-11-30 | Disposition: A | Discharge status: Home / Self Care | Payer: Self-pay | Source: Ambulatory Visit | Attending: Urology | Admitting: Urology

## 2011-11-30 ENCOUNTER — Encounter (HOSPITAL_COMMUNITY): Payer: Self-pay

## 2011-11-30 ENCOUNTER — Ambulatory Visit (INDEPENDENT_AMBULATORY_CARE_PROVIDER_SITE_OTHER): Payer: Self-pay | Admitting: *Deleted

## 2011-11-30 DIAGNOSIS — Z111 Encounter for screening for respiratory tuberculosis: Secondary | ICD-10-CM

## 2011-11-30 DIAGNOSIS — Z23 Encounter for immunization: Secondary | ICD-10-CM

## 2011-11-30 HISTORY — DX: Anxiety disorder, unspecified: F41.9

## 2011-11-30 LAB — SURGICAL PCR SCREEN: MRSA, PCR: NEGATIVE

## 2011-11-30 LAB — CBC
HCT: 41.3 % (ref 36.0–46.0)
Hemoglobin: 14 g/dL (ref 12.0–15.0)
RBC: 4.46 MIL/uL (ref 3.87–5.11)
RDW: 12.4 % (ref 11.5–15.5)
WBC: 7.8 10*3/uL (ref 4.0–10.5)

## 2011-11-30 LAB — TYPE AND SCREEN: ABO/RH(D): A POS

## 2011-11-30 LAB — PROTIME-INR: INR: 1.03 (ref 0.00–1.49)

## 2011-11-30 LAB — APTT: aPTT: 29 seconds (ref 24–37)

## 2011-11-30 NOTE — Patient Instructions (Addendum)
18 Amanda Villarreal  11/30/2011   Your procedure is scheduled on: 12/04/11  Report to [facility] at 5:30AM.  Call this number if you have problems the morning of surgery [phone]: [phone]   Remember:   Do not eat food or drink liquids After Midnight.     Take these medicines the morning of surgery with A SIP OF WATER: Paxil   Do not wear jewelry, make-up or nail polish.  Do not wear lotions, powders, or perfumes. You may wear deodorant.  Do not shave 48 hours prior to surgery. Men may shave face and neck.  Do not bring valuables to the hospital.  Contacts, dentures or bridgework may not be worn into surgery.  Leave suitcase in the car. After surgery it may be brought to your room.  For patients admitted to the hospital, checkout time is 11:00 AM the day of discharge.     Special Instructions: Shower using CHG 2 nights before surgery and the night before surgery.  If you shower the day of surgery use CHG.  Use special wash - you have one bottle of CHG for all showers.  You should use approximately 1/3 of the bottle for each shower.   Please read over the following fact sheets that you were given: MRSA Information.  Birdie Sons, RN  pre op nurse call if needed [phone]

## 2011-12-03 MED ORDER — GENTAMICIN SULFATE 40 MG/ML IJ SOLN
320.0000 mg | INTRAMUSCULAR | Status: AC
  Administered 2011-12-04: 320 mg via INTRAVENOUS
  Filled 2011-12-03 (×2): qty 8

## 2011-12-03 NOTE — H&P (Signed)
History of Present Illness   Ms. 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 Her MRI supported this and I did not see an obvious urethral diverticulum.   She has stable frequency.   I discussed with her removal of the probable cyst versus a diverticulum.   I drew her a picture and we talked about reconstructive surgery in detail. Pros, cons, general surgical and anesthetic risks, and other options including watchful waiting were discussed. She understands that surgery is successful in approximately 85% of cases. Failure rates and the risk of persistence/recurrence/and worsening of the problem were discussed. Surgical risks were described but not limited to the discussion of injury to neighboring structures including the bowel (with possible life-threatening sepsis and colostomy), bladder, urethra, vagina (all resulting in further surgery), and ureter (resulting in re-implantation). We talked about injury to nerves/soft tissue leading to debilitating and intractable pelvic, abdominal, and lower extremity pain syndromes and neuropathies. The risks of dyspareunia, vaginal narrowing and shortening with sequelae were discussed. The risk of persistent, de novo, or worsening incontinence/dysfunction was discussed. Bleeding risks, transfusion rates, and infection were discussed. The usual post-operative course was described. The patient understands that she might not reach her treatment goal and that she might be worse following surgery.  She would like to proceed with surgery. If I cannot find a communication she probably will not need a catheter. I will schedule this for staying overnight but she may go home the same day.   I stressed complications including de novo stress incontinence, fistula with sequelae and recurrence. Review of systems: No other change in bowel or neurologic status.   Urinalysis: I reviewed, negative.   Amanda Villarreal has  had some bleeding with urination. I thought it best to reexamine her.   On repeat pelvic examination did not see any source for bleeding within the vagina or around the area of the cyst. There is no evidence of vaginitis or _____    Past Medical History Problems  1. History of  Anxiety (Symptom) 300.00  Surgical History Problems  1. History of  No Surgical Problems  Current Meds 1. ALPRAZolam 0.5 MG Oral Tablet; Therapy: (Recorded:18Sep2013) to 2. Flexeril 5 MG TABS; Therapy: (Recorded:18Sep2013) to 3. Paxil TABS; Therapy: (Recorded:18Sep2013) to  Allergies Medication  1. No Known Drug Allergies  Family History Problems  1. Paternal history of  Death In The Family Father father passed @ age 54brain anerysm 2. Maternal history of  Nephrolithiasis  Social History Problems  1. Alcohol Use 1-2 drinks a week 2. Caffeine Use 2 drinks daily 3. Marital History - Single 4. Occupation: Consulting civil engineer 5. Tobacco Use 305.1 smokes 1/2 pack dailysmoking for 8 years  Results/Data     Assessment Assessed  1. Adenitis Of Skene's Gland [phone]  Plan   Discussion/Summary   Amanda Villarreal would like to proceed with excision of the Skene's duct cyst.  After a thorough review of the management options for the patient's condition the patient  elected to proceed with surgical therapy as noted above. We have discussed the potential benefits and risks of the procedure, side effects of the proposed treatment, the likelihood of the patient achieving the goals of the procedure, and any potential problems that might occur during the procedure or recuperation. Informed consent has been obtained.

## 2011-12-04 ENCOUNTER — Encounter (HOSPITAL_COMMUNITY)
Admission: RE | Disposition: A | Discharge status: Home / Self Care | Payer: Self-pay | Source: Ambulatory Visit | Attending: Urology

## 2011-12-04 ENCOUNTER — Ambulatory Visit (HOSPITAL_COMMUNITY): Payer: Self-pay | Admitting: Anesthesiology

## 2011-12-04 ENCOUNTER — Ambulatory Visit (HOSPITAL_COMMUNITY)
Admission: RE | Admit: 2011-12-04 | Discharge: 2011-12-04 | Disposition: A | Discharge status: Home / Self Care | Payer: Self-pay | Source: Ambulatory Visit | Attending: Urology | Admitting: Urology

## 2011-12-04 ENCOUNTER — Encounter (HOSPITAL_COMMUNITY): Payer: Self-pay | Admitting: *Deleted

## 2011-12-04 ENCOUNTER — Encounter (HOSPITAL_COMMUNITY): Payer: Self-pay | Admitting: Anesthesiology

## 2011-12-04 DIAGNOSIS — F411 Generalized anxiety disorder: Secondary | ICD-10-CM | POA: Insufficient documentation

## 2011-12-04 DIAGNOSIS — N361 Urethral diverticulum: Secondary | ICD-10-CM | POA: Insufficient documentation

## 2011-12-04 DIAGNOSIS — N398 Other specified disorders of urinary system: Secondary | ICD-10-CM | POA: Insufficient documentation

## 2011-12-04 DIAGNOSIS — Z841 Family history of disorders of kidney and ureter: Secondary | ICD-10-CM | POA: Insufficient documentation

## 2011-12-04 DIAGNOSIS — F17200 Nicotine dependence, unspecified, uncomplicated: Secondary | ICD-10-CM | POA: Insufficient documentation

## 2011-12-04 HISTORY — PX: URETHRAL DIVERTICULUM REPAIR: SHX5148

## 2011-12-04 HISTORY — PX: EAR CYST EXCISION: SHX22

## 2011-12-04 HISTORY — PX: CYSTOSCOPY: SHX5120

## 2011-12-04 SURGERY — URETHROPLASTY, FOR DIVERTICULUM
Anesthesia: General | Wound class: Clean Contaminated

## 2011-12-04 MED ORDER — ESTRADIOL 0.1 MG/GM VA CREA
TOPICAL_CREAM | VAGINAL | Status: DC | PRN
  Administered 2011-12-04: 1 via VAGINAL

## 2011-12-04 MED ORDER — HYDROMORPHONE HCL PF 1 MG/ML IJ SOLN
INTRAMUSCULAR | Status: AC
  Filled 2011-12-04: qty 1

## 2011-12-04 MED ORDER — OXYCODONE HCL 5 MG/5ML PO SOLN: 5.0000 mg | Freq: Once | ORAL | Status: AC | PRN

## 2011-12-04 MED ORDER — GLYCOPYRROLATE 0.2 MG/ML IJ SOLN
INTRAMUSCULAR | Status: DC | PRN
  Administered 2011-12-04: 0.6 mg via INTRAVENOUS

## 2011-12-04 MED ORDER — LIDOCAINE-EPINEPHRINE (PF) 1 %-1:200000 IJ SOLN
INTRAMUSCULAR | Status: AC
  Filled 2011-12-04: qty 10

## 2011-12-04 MED ORDER — HYDROCODONE-ACETAMINOPHEN 5-325 MG PO TABS: 1.0000 | ORAL_TABLET | Freq: Four times a day (QID) | ORAL | Status: DC | PRN

## 2011-12-04 MED ORDER — ESTRADIOL 0.1 MG/GM VA CREA
TOPICAL_CREAM | VAGINAL | Status: AC
  Filled 2011-12-04: qty 85

## 2011-12-04 MED ORDER — FENTANYL CITRATE 0.05 MG/ML IJ SOLN
INTRAMUSCULAR | Status: DC | PRN
  Administered 2011-12-04 (×4): 50 ug via INTRAVENOUS

## 2011-12-04 MED ORDER — ACETAMINOPHEN 10 MG/ML IV SOLN
INTRAVENOUS | Status: AC
  Filled 2011-12-04: qty 100

## 2011-12-04 MED ORDER — ONDANSETRON HCL 4 MG/2ML IJ SOLN
INTRAMUSCULAR | Status: DC | PRN
  Administered 2011-12-04: 4 mg via INTRAVENOUS

## 2011-12-04 MED ORDER — ACETAMINOPHEN 10 MG/ML IV SOLN: 1000.0000 mg | Freq: Once | INTRAVENOUS | Status: DC | PRN

## 2011-12-04 MED ORDER — HYDROMORPHONE HCL PF 1 MG/ML IJ SOLN
0.2500 mg | INTRAMUSCULAR | Status: DC | PRN
  Administered 2011-12-04 (×3): 0.5 mg via INTRAVENOUS

## 2011-12-04 MED ORDER — LACTATED RINGERS IV SOLN
INTRAVENOUS | Status: DC | PRN
  Administered 2011-12-04 (×2): via INTRAVENOUS

## 2011-12-04 MED ORDER — SUCCINYLCHOLINE CHLORIDE 20 MG/ML IJ SOLN
INTRAMUSCULAR | Status: DC | PRN
  Administered 2011-12-04: 100 mg via INTRAVENOUS

## 2011-12-04 MED ORDER — PROPOFOL 10 MG/ML IV BOLUS
INTRAVENOUS | Status: DC | PRN
  Administered 2011-12-04: 160 mg via INTRAVENOUS

## 2011-12-04 MED ORDER — STERILE WATER FOR IRRIGATION IR SOLN
Status: DC | PRN
  Administered 2011-12-04: 3000 mL

## 2011-12-04 MED ORDER — ROCURONIUM BROMIDE 100 MG/10ML IV SOLN
INTRAVENOUS | Status: DC | PRN
  Administered 2011-12-04 (×2): 10 mg via INTRAVENOUS
  Administered 2011-12-04: 20 mg via INTRAVENOUS

## 2011-12-04 MED ORDER — OXYCODONE HCL 5 MG PO TABS
5.0000 mg | ORAL_TABLET | Freq: Once | ORAL | Status: AC | PRN
  Administered 2011-12-04: 5 mg via ORAL
  Filled 2011-12-04: qty 1

## 2011-12-04 MED ORDER — SODIUM CHLORIDE 0.9 % IV SOLN
1.5000 g | INTRAVENOUS | Status: AC
  Administered 2011-12-04: 1.5 g via INTRAVENOUS

## 2011-12-04 MED ORDER — LIDOCAINE HCL (CARDIAC) 20 MG/ML IV SOLN
INTRAVENOUS | Status: DC | PRN
  Administered 2011-12-04: 50 mg via INTRAVENOUS

## 2011-12-04 MED ORDER — DEXAMETHASONE SODIUM PHOSPHATE 10 MG/ML IJ SOLN
INTRAMUSCULAR | Status: DC | PRN
  Administered 2011-12-04: 10 mg via INTRAVENOUS

## 2011-12-04 MED ORDER — SODIUM CHLORIDE 0.9 % IR SOLN
Status: DC | PRN
  Administered 2011-12-04: 09:00:00

## 2011-12-04 MED ORDER — ACETAMINOPHEN 10 MG/ML IV SOLN
INTRAVENOUS | Status: DC | PRN
  Administered 2011-12-04: 1000 mg via INTRAVENOUS

## 2011-12-04 MED ORDER — PROMETHAZINE HCL 25 MG/ML IJ SOLN: 6.2500 mg | INTRAMUSCULAR | Status: DC | PRN

## 2011-12-04 MED ORDER — MIDAZOLAM HCL 5 MG/5ML IJ SOLN
INTRAMUSCULAR | Status: DC | PRN
  Administered 2011-12-04: 2 mg via INTRAVENOUS

## 2011-12-04 MED ORDER — SODIUM CHLORIDE 0.9 % IV SOLN
INTRAVENOUS | Status: AC
  Filled 2011-12-04: qty 1.5

## 2011-12-04 MED ORDER — CEPHALEXIN 500 MG PO CAPS: 500.0000 mg | ORAL_CAPSULE | Freq: Three times a day (TID) | ORAL | Status: DC

## 2011-12-04 MED ORDER — MEPERIDINE HCL 50 MG/ML IJ SOLN: 6.2500 mg | INTRAMUSCULAR | Status: DC | PRN

## 2011-12-04 MED ORDER — INDIGOTINDISULFONATE SODIUM 8 MG/ML IJ SOLN
INTRAMUSCULAR | Status: AC
  Filled 2011-12-04: qty 5

## 2011-12-04 MED ORDER — INDIGOTINDISULFONATE SODIUM 8 MG/ML IJ SOLN
INTRAMUSCULAR | Status: DC | PRN
  Administered 2011-12-04: 5 mL

## 2011-12-04 MED ORDER — NEOSTIGMINE METHYLSULFATE 1 MG/ML IJ SOLN
INTRAMUSCULAR | Status: DC | PRN
  Administered 2011-12-04: 4 mg via INTRAVENOUS

## 2011-12-04 SURGICAL SUPPLY — 53 items
BAG URINE DRAINAGE (UROLOGICAL SUPPLIES) ×2 IMPLANT
BAG URO CATCHER STRL LF (DRAPE) ×2 IMPLANT
BANDAGE COBAN STERILE 2 (GAUZE/BANDAGES/DRESSINGS) IMPLANT
BANDAGE GAUZE ELAST BULKY 4 IN (GAUZE/BANDAGES/DRESSINGS) IMPLANT
BLADE HEX COATED 2.75 (ELECTRODE) ×2 IMPLANT
BLADE SURG 15 STRL LF DISP TIS (BLADE) ×2 IMPLANT
BLADE SURG 15 STRL SS (BLADE) ×2
BLADE SURG SZ10 CARB STEEL (BLADE) ×2 IMPLANT
BRIEF STRETCH FOR OB PAD LRG (UNDERPADS AND DIAPERS) IMPLANT
CATH FOLEY 2WAY SLVR  5CC 14FR (CATHETERS) ×1
CATH FOLEY 2WAY SLVR  5CC 18FR (CATHETERS)
CATH FOLEY 2WAY SLVR 5CC 14FR (CATHETERS) ×1 IMPLANT
CATH FOLEY 2WAY SLVR 5CC 18FR (CATHETERS) IMPLANT
CATH INTERMIT  6FR 70CM (CATHETERS) IMPLANT
CATH ROBINSON RED A/P 16FR (CATHETERS) IMPLANT
CATH ROBINSON RED A/P 18FR (CATHETERS) IMPLANT
CLOTH BEACON ORANGE TIMEOUT ST (SAFETY) ×2 IMPLANT
DRAIN PENROSE 18X1/4 LTX STRL (WOUND CARE) ×2 IMPLANT
DRAPE CAMERA CLOSED 9X96 (DRAPES) IMPLANT
DRESSING TELFA 8X3 (GAUZE/BANDAGES/DRESSINGS) IMPLANT
GAUZE PACKING 2X5 YD STERILE (GAUZE/BANDAGES/DRESSINGS) IMPLANT
GAUZE SPONGE 4X4 16PLY XRAY LF (GAUZE/BANDAGES/DRESSINGS) ×4 IMPLANT
GLOVE BIOGEL M STRL SZ7.5 (GLOVE) ×6 IMPLANT
GOWN PREVENTION PLUS XLARGE (GOWN DISPOSABLE) IMPLANT
GOWN STRL REIN XL XLG (GOWN DISPOSABLE) ×4 IMPLANT
HOLDER FOLEY CATH W/STRAP (MISCELLANEOUS) ×2 IMPLANT
KIT BASIN OR (CUSTOM PROCEDURE TRAY) ×2 IMPLANT
NEEDLE HYPO 25X1 1.5 SAFETY (NEEDLE) ×2 IMPLANT
NEEDLE INJECT RIGID (NEEDLE) IMPLANT
PACK CYSTO (CUSTOM PROCEDURE TRAY) ×2 IMPLANT
PAK SCROTO (SET/KITS/TRAYS/PACK) IMPLANT
PENCIL BUTTON HOLSTER BLD 10FT (ELECTRODE) ×2 IMPLANT
PLUG CATH AND CAP STER (CATHETERS) ×2 IMPLANT
RETRACTOR STAY HOOK 5MM (MISCELLANEOUS) ×2 IMPLANT
SHEET LAVH (DRAPES) ×2 IMPLANT
SPONGE LAP 4X18 X RAY DECT (DISPOSABLE) IMPLANT
STRIP CLOSURE SKIN 1/2X4 (GAUZE/BANDAGES/DRESSINGS) IMPLANT
SUT CHROMIC 3 0 SH 27 (SUTURE) IMPLANT
SUT SILK 2 0 30  PSL (SUTURE) ×1
SUT SILK 2 0 30 PSL (SUTURE) ×1 IMPLANT
SUT SILK 4 0 SH CR/8 (SUTURE) IMPLANT
SUT VIC AB 2-0 SH 27 (SUTURE) ×2
SUT VIC AB 2-0 SH 27X BRD (SUTURE) ×2 IMPLANT
SUT VIC AB 3-0 SH 27 (SUTURE) ×2
SUT VIC AB 3-0 SH 27XBRD (SUTURE) ×2 IMPLANT
SUT VIC AB 4-0 RB1 27 (SUTURE) ×3
SUT VIC AB 4-0 RB1 27XBRD (SUTURE) ×3 IMPLANT
SUT VIC AB 4-0 SH 27 (SUTURE)
SUT VIC AB 4-0 SH 27XBRD (SUTURE) IMPLANT
SYRINGE 10CC LL (SYRINGE) ×2 IMPLANT
TUBING CONNECTING 10 (TUBING) ×2 IMPLANT
WATER STERILE IRR 1500ML POUR (IV SOLUTION) IMPLANT
YANKAUER SUCT BULB TIP 10FT TU (MISCELLANEOUS) ×2 IMPLANT

## 2011-12-04 NOTE — Progress Notes (Signed)
Dr. Sherron Monday in to see patient prior to discharge.

## 2011-12-04 NOTE — Anesthesia Preprocedure Evaluation (Addendum)
Anesthesia Evaluation  Patient identified by MRN, date of birth, ID band Patient awake    Reviewed: Allergy & Precautions, H&P , NPO status , Patient's Chart, lab work & pertinent test results  Airway       Dental  (+) Dental Advisory Given   Pulmonary Current Smoker,          Cardiovascular - CAD and - Past MI     Neuro/Psych PSYCHIATRIC DISORDERS Anxiety negative neurological ROS     GI/Hepatic negative GI ROS, Neg liver ROS,   Endo/Other  negative endocrine ROS  Renal/GU negative Renal ROS     Musculoskeletal negative musculoskeletal ROS (+)   Abdominal   Peds  Hematology negative hematology ROS (+)   Anesthesia Other Findings   Reproductive/Obstetrics                          Anesthesia Physical Anesthesia Plan  ASA: II  Anesthesia Plan: General   Post-op Pain Management:    Induction: Intravenous  Airway Management Planned: Oral ETT  Additional Equipment:   Intra-op Plan:   Post-operative Plan: Extubation in OR  Informed Consent: I have reviewed the patients History and Physical, chart, labs and discussed the procedure including the risks, benefits and alternatives for the proposed anesthesia with the patient or authorized representative who has indicated his/her understanding and acceptance.   Dental advisory given  Plan Discussed with: CRNA  Anesthesia Plan Comments:       Anesthesia Quick Evaluation

## 2011-12-04 NOTE — H&P (Signed)
Preoperative diagnosis: Urethral diverticulum or Skene's duct cyst Postoperative diagnosis: Skene's duct cyst Surgery: Excision of Skene's duct cyst and cystoscopy Surgeon: Dr. Lorin Picket Kaedance Magos Assistant Pecola Leisure  The patient consented above procedure. Extra care was taken for leg positioning to minimize the risk of compartment syndrome and deep vein thrombosis and neuropathy. Preoperative antibiotics were given. Preoperative laboratory tests were normal  The patient had an obvious Skene's duct cyst approximately 2.5 x 2 cm in size extending from the midline to the patient's right side. I use my usual retractors and double-ring retractor to expose the cyst. I made a midline incision after marking it with a blue marking pen. The cyst was opened and yellow fluid was expressed. It was cultured. It likely represented a sterile abscess  The vaginal area and cystoscopy were irrigated copiously. Even with the cyst decompressed it was quite easy to excise at from its overlying vaginal wall mucosa and soft tissue. I had to stay away from the urethra at 2:00. She'll large capacious urethral meatus. The cystoscopy was removed almost in its entirety. There was a lot of irrigation. I fulgurated the very small roof strip.  I inserted methylene blue at 500 cc in the bladder. I did my usual compression technique and she did not have an opening from the cyst to the distal urethra  I closed the soft tissue with interrupted 3-0 Vicryl working from in to out closing the dead space. I closed the vaginal mucosa after trimming it minimally with interrupted 4-0 Vicryl. I rebuilt the urethral meatus nicely.  Vaginal pack with Estrace was inserted. Foley catheter was left in place. The patient be sent home today without a catheter

## 2011-12-04 NOTE — Anesthesia Postprocedure Evaluation (Signed)
Anesthesia Post Note  Patient: Amanda Villarreal  Procedure(s) Performed: Procedure(s) (LRB): URETHRAL DIVERTICULUM REPAIR (N/A) CYSTOSCOPY (N/A) CYST REMOVAL (N/A)  Anesthesia type: General  Patient location: PACU  Post pain: Pain level controlled  Post assessment: Post-op Vital signs reviewed  Last Vitals: BP 96/58  Pulse 52  Temp 36.8 C (Oral)  Resp 13  SpO2 96%  Post vital signs: Reviewed  Level of consciousness: sedated  Complications: No apparent anesthesia complications

## 2011-12-04 NOTE — Transfer of Care (Signed)
Immediate Anesthesia Transfer of Care Note  Patient: Amanda Villarreal  Procedure(s) Performed: Procedure(s) (LRB) with comments: URETHRAL DIVERTICULUM REPAIR (N/A) CYSTOSCOPY (N/A) CYST REMOVAL (N/A)  Patient Location: PACU  Anesthesia Type:General  Level of Consciousness: awake  Airway & Oxygen Therapy: Patient Spontanous Breathing and Patient connected to face mask oxygen  Post-op Assessment: Report given to PACU RN and Post -op Vital signs reviewed and stable  Post vital signs: Reviewed and stable  Complications: No apparent anesthesia complications

## 2011-12-04 NOTE — Interval H&P Note (Signed)
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 After consideration of risks, benefits and other options for treatment, the patient has consented to  Procedure(s) (LRB) with comments: URETHRAL DIVERTICULUM REPAIR (N/A) CYSTOSCOPY (N/A) CYST REMOVAL (N/A) as a surgical intervention .  The patient's history has been reviewed, patient examined, no change in status, stable for surgery.  I have reviewed the patient's chart and labs.  Questions were answered to the patient's satisfaction.     Janaia Kozel A

## 2011-12-05 ENCOUNTER — Encounter (HOSPITAL_COMMUNITY): Payer: Self-pay | Admitting: Urology

## 2011-12-07 LAB — CULTURE, ROUTINE-ABSCESS: Gram Stain: NONE SEEN

## 2011-12-09 LAB — ANAEROBIC CULTURE: Gram Stain: NONE SEEN

## 2011-12-26 NOTE — Op Note (Signed)
Preoperative diagnosis: Urethral diverticulum or Skene's duct cyst  Postoperative diagnosis: Skene's duct cyst  Surgery: Excision of Skene's duct cyst and cystoscopy  Surgeon: Dr. Lorin Picket Avo Schlachter  Assistant Pecola Leisure  The patient consented above procedure. Extra care was taken for leg positioning to minimize the risk of compartment syndrome and deep vein thrombosis and neuropathy. Preoperative antibiotics were given. Preoperative laboratory tests were normal  The patient had an obvious Skene's duct cyst approximately 2.5 x 2 cm in size extending from the midline to the patient's right side. I use my usual retractors and double-ring retractor to expose the cyst. I made a midline incision after marking it with a blue marking pen. The cyst was opened and yellow fluid was expressed. It was cultured. It likely represented a sterile abscess  The vaginal area and cystoscopy were irrigated copiously. Even with the cyst decompressed it was quite easy to excise at from its overlying vaginal wall mucosa and soft tissue. I had to stay away from the urethra at 2:00. She'll large capacious urethral meatus. The cystoscopy was removed almost in its entirety. There was a lot of irrigation. I fulgurated the very small roof strip.  I inserted methylene blue at 500 cc in the bladder. I did my usual compression technique and she did not have an opening from the cyst to the distal urethra  I closed the soft tissue with interrupted 3-0 Vicryl working from in to out closing the dead space. I closed the vaginal mucosa after trimming it minimally with interrupted 4-0 Vicryl. I rebuilt the urethral meatus nicely.  Vaginal pack with Estrace was inserted. Foley catheter was left in place. The patient be sent home today without a catheter  Routing History.Marland KitchenMarland Kitchen

## 2012-02-01 ENCOUNTER — Telehealth: Payer: Self-pay | Admitting: Family Medicine

## 2012-02-01 NOTE — Telephone Encounter (Signed)
Pt just started at [facility] and needs [place] to fax over her shot record - she had hep b series last year and flu shot  Fax to Employee health screening - [phone]

## 2012-02-01 NOTE — Telephone Encounter (Signed)
Records faxed, confirmed receipt.

## 2012-02-15 ENCOUNTER — Telehealth: Payer: Self-pay | Admitting: Family Medicine

## 2012-02-15 NOTE — Telephone Encounter (Signed)
Received after hours call from patient.  She says she has a tooth infection.  This has happened before, and she does not want to go to the ER when all she needs is antibiotics.  She works at [employer] and has to be there on Monday. I let her know that I cannot prescribe antibiotics over the phone, she has to be seen.  I recommended going to urgent care before they close tonight or tomorrow morning for evaluation.  She voices understanding.   Amanda Villarreal 02/15/2012 7:17 PM

## 2012-08-31 ENCOUNTER — Emergency Department (HOSPITAL_COMMUNITY): Payer: Self-pay

## 2012-08-31 ENCOUNTER — Emergency Department (HOSPITAL_COMMUNITY)
Admission: EM | Admit: 2012-08-31 | Discharge: 2012-08-31 | Disposition: A | Discharge status: Home / Self Care | Payer: Self-pay | Attending: Emergency Medicine | Admitting: Emergency Medicine

## 2012-08-31 ENCOUNTER — Encounter (HOSPITAL_COMMUNITY): Payer: Self-pay | Admitting: *Deleted

## 2012-08-31 DIAGNOSIS — R05 Cough: Secondary | ICD-10-CM | POA: Insufficient documentation

## 2012-08-31 DIAGNOSIS — R000 Tachycardia, unspecified: Secondary | ICD-10-CM | POA: Insufficient documentation

## 2012-08-31 DIAGNOSIS — R509 Fever, unspecified: Secondary | ICD-10-CM | POA: Insufficient documentation

## 2012-08-31 DIAGNOSIS — R062 Wheezing: Secondary | ICD-10-CM | POA: Diagnosis present

## 2012-08-31 DIAGNOSIS — F17200 Nicotine dependence, unspecified, uncomplicated: Secondary | ICD-10-CM | POA: Insufficient documentation

## 2012-08-31 DIAGNOSIS — R059 Cough, unspecified: Secondary | ICD-10-CM | POA: Insufficient documentation

## 2012-08-31 DIAGNOSIS — Z8659 Personal history of other mental and behavioral disorders: Secondary | ICD-10-CM | POA: Insufficient documentation

## 2012-08-31 DIAGNOSIS — Z8742 Personal history of other diseases of the female genital tract: Secondary | ICD-10-CM | POA: Insufficient documentation

## 2012-08-31 DIAGNOSIS — J069 Acute upper respiratory infection, unspecified: Secondary | ICD-10-CM | POA: Diagnosis present

## 2012-08-31 MED ORDER — ALBUTEROL SULFATE HFA 108 (90 BASE) MCG/ACT IN AERS
6.0000 | INHALATION_SPRAY | Freq: Once | RESPIRATORY_TRACT | Status: AC
  Administered 2012-08-31: 6 via RESPIRATORY_TRACT
  Filled 2012-08-31: qty 6.7

## 2012-08-31 NOTE — ED Provider Notes (Signed)
CSN: [id number]     Arrival date & time 08/31/12  2051 History     First MD Initiated Contact with Patient 08/31/12 2143     Chief Complaint  Patient presents with  . Shortness of Breath   (Consider location/radiation/quality/duration/timing/severity/associated sxs/prior Treatment) Patient is a 22 y.o. female presenting with shortness of breath.  Shortness of Breath Severity:  Mild Onset quality:  Gradual Timing:  Constant Progression:  Partially resolved Chronicity:  New Relieved by:  Inhaler Worsened by:  Nothing tried Ineffective treatments:  None tried Associated symptoms: cough, fever and wheezing   Associated symptoms: no abdominal pain, no chest pain, no headaches, no neck pain and no vomiting     Past Medical History  Diagnosis Date  . Bartholin's gland cyst 04/2011  . Anxiety    Past Surgical History  Procedure Laterality Date  . No past surgeries    . Urethral diverticulum repair  12/04/2011    Procedure: URETHRAL DIVERTICULUM REPAIR;  Surgeon: Martina Sinner, MD;  Location: [facility];  Service: Urology;  Laterality: N/A;  . Cystoscopy  12/04/2011    Procedure: CYSTOSCOPY;  Surgeon: Martina Sinner, MD;  Location: [facility];  Service: Urology;  Laterality: N/A;  . Ear cyst excision  12/04/2011    Procedure: CYST REMOVAL;  Surgeon: Martina Sinner, MD;  Location: [facility];  Service: Urology;  Laterality: N/A;   Family History  Problem Relation Age of Onset  . Hypertension Mother   . Diabetes Mother   . Fibromyalgia Mother   . Arthritis Mother   . Nephrolithiasis Mother   . Lung cancer Maternal Grandfather    History  Substance Use Topics  . Smoking status: Current Every Day Smoker -- 0.25 packs/day for 9 years    Types: Cigarettes  . Smokeless tobacco: Never Used  . Alcohol Use: No   OB History   Grav Para Term Preterm Abortions TAB SAB Ect Mult Living   2 1 1  1           Review of Systems  Constitutional: Positive for fever. Negative for  fatigue.  HENT: Negative for congestion, drooling and neck pain.   Eyes: Negative for pain.  Respiratory: Positive for cough, shortness of breath and wheezing.   Cardiovascular: Negative for chest pain.  Gastrointestinal: Negative for nausea, vomiting, abdominal pain and diarrhea.  Genitourinary: Negative for dysuria and hematuria.  Musculoskeletal: Negative for back pain and gait problem.  Skin: Negative for color change.  Neurological: Negative for dizziness and headaches.  Hematological: Negative for adenopathy.  Psychiatric/Behavioral: Negative for behavioral problems.  All other systems reviewed and are negative.    Allergies  Review of patient's allergies indicates no known allergies.  Home Medications   Current Outpatient Rx  Name  Route  Sig  Dispense  Refill  . albuterol (PROVENTIL HFA;VENTOLIN HFA) 108 (90 BASE) MCG/ACT inhaler   Inhalation   Inhale 2 puffs into the lungs every 6 (six) hours as needed for wheezing.         Marland Kitchen albuterol (PROVENTIL) (2.5 MG/3ML) 0.083% nebulizer solution   Nebulization   Take 2.5 mg by nebulization every 6 (six) hours as needed for wheezing.          BP 113/84  Pulse 127  Temp(Src) 100.5 F (38.1 C) (Oral)  Resp 20  SpO2 100%  LMP 08/19/2012 Physical Exam  Nursing note and vitals reviewed. Constitutional: She is oriented to person, place, and time. She appears well-developed and well-nourished.  HENT:  Head: Normocephalic.  Mouth/Throat: No oropharyngeal exudate.  Eyes: Conjunctivae and EOM are normal. Pupils are equal, round, and reactive to light.  Neck: Normal range of motion. Neck supple.  Cardiovascular: Regular rhythm, normal heart sounds and intact distal pulses.  Exam reveals no gallop and no friction rub.   No murmur heard. Tachycardic, HR 127  Pulmonary/Chest: Effort normal. No respiratory distress. She has wheezes (mild expiratory wheeze noted in left upper lobe).  Abdominal: Soft. Bowel sounds are normal.  There is no tenderness. There is no rebound and no guarding.  Musculoskeletal: Normal range of motion. She exhibits no edema and no tenderness.  Neurological: She is alert and oriented to person, place, and time.  Skin: Skin is warm and dry.  Psychiatric: She has a normal mood and affect. Her behavior is normal.    ED Course   Procedures (including critical care time)  Labs Reviewed - No data to display Dg Chest 2 View  08/31/2012   *RADIOLOGY REPORT*  Clinical Data: Cough, shortness of breath  CHEST - 2 VIEW  Comparison: None.  Findings: Lungs are clear.  No pleural effusion or pneumothorax. Cardiomediastinal contours are within normal range.  No acute osseous finding.  IMPRESSION: No radiographic evidence of acute cardiopulmonary process.   Original Report Authenticated By: Jearld Lesch, M.D.   1. Viral URI with cough   2. Wheezing     MDM  9:56 PM 22 y.o. female pw cough since yesterday. Sob began this evening. Took her grandmothers breathing tx, and felt better. Mild wheezing on exam. No hx of asthma, but wheezing w/ previous URI's. Will get CXR d/t low grade temp. Alb inhaler here. Tachycardia likely from breathing tx pta.   11:01 PM: Pt feeling mildly better. Very mild wheeze still noted in the LUL. Pt continues to appear well. Imaging non-contrib. Pt got alb inhaler here to go home w/, does not want Rx for home, do not think she needs std's at this time. I have discussed the diagnosis/risks/treatment options with the patient and believe the pt to be eligible for discharge home to follow-up with pcp as needed. We also discussed returning to the ED immediately if new or worsening sx occur. We discussed the sx which are most concerning (e.g., sob, cp) that necessitate immediate return. Any new prescriptions provided to the patient are listed below.  New Prescriptions   No medications on file      Junius Argyle, MD 08/31/12 2322

## 2012-08-31 NOTE — ED Notes (Signed)
Today cough and shortness of breath, states she is having difficulty breathing, Sates 100%, took an Albuterol at home,

## 2013-01-12 ENCOUNTER — Ambulatory Visit: Payer: Medicaid Other | Admitting: Psychology

## 2013-01-21 ENCOUNTER — Ambulatory Visit: Payer: Self-pay | Admitting: Physician Assistant

## 2013-01-23 ENCOUNTER — Ambulatory Visit (INDEPENDENT_AMBULATORY_CARE_PROVIDER_SITE_OTHER): Payer: 59 | Admitting: Psychology

## 2013-01-23 DIAGNOSIS — F4323 Adjustment disorder with mixed anxiety and depressed mood: Secondary | ICD-10-CM

## 2013-01-27 ENCOUNTER — Encounter: Payer: Self-pay | Admitting: Family Medicine

## 2013-01-28 ENCOUNTER — Ambulatory Visit: Payer: Medicaid Other | Admitting: Psychology

## 2013-02-12 ENCOUNTER — Ambulatory Visit: Payer: Self-pay | Admitting: Family Medicine

## 2013-02-19 ENCOUNTER — Encounter (HOSPITAL_COMMUNITY): Payer: Self-pay | Admitting: Emergency Medicine

## 2013-02-19 ENCOUNTER — Emergency Department (HOSPITAL_COMMUNITY)
Admission: EM | Admit: 2013-02-19 | Discharge: 2013-02-19 | Disposition: A | Discharge status: Home / Self Care | Payer: Medicaid Other | Attending: Emergency Medicine | Admitting: Emergency Medicine

## 2013-02-19 DIAGNOSIS — R062 Wheezing: Secondary | ICD-10-CM | POA: Insufficient documentation

## 2013-02-19 DIAGNOSIS — Z8659 Personal history of other mental and behavioral disorders: Secondary | ICD-10-CM | POA: Insufficient documentation

## 2013-02-19 DIAGNOSIS — L03319 Cellulitis of trunk, unspecified: Principal | ICD-10-CM

## 2013-02-19 DIAGNOSIS — L0291 Cutaneous abscess, unspecified: Secondary | ICD-10-CM

## 2013-02-19 DIAGNOSIS — F17200 Nicotine dependence, unspecified, uncomplicated: Secondary | ICD-10-CM | POA: Insufficient documentation

## 2013-02-19 DIAGNOSIS — Z8742 Personal history of other diseases of the female genital tract: Secondary | ICD-10-CM | POA: Insufficient documentation

## 2013-02-19 DIAGNOSIS — L02219 Cutaneous abscess of trunk, unspecified: Secondary | ICD-10-CM | POA: Insufficient documentation

## 2013-02-19 NOTE — ED Provider Notes (Signed)
Medical screening examination/treatment/procedure(s) were performed by non-physician practitioner and as supervising physician I was immediately available for consultation/collaboration.  EKG Interpretation   None         Dagmar HaitWilliam Guinn Delarosa, MD 02/19/13 [phone]

## 2013-02-19 NOTE — ED Notes (Signed)
Pt has area to left hip for a couple of months; states she knows it is MRSA; no drainage; minimal redness

## 2013-02-19 NOTE — Discharge Instructions (Signed)
Ibuprofen or Tylenol for pain. Keep warm compresses on the area several times a day. Try warm soapy water soaks. Followup with primary care Dr. for recheck.   Abscess Care After An abscess (also called a boil or furuncle) is an infected area that contains a collection of pus. Signs and symptoms of an abscess include pain, tenderness, redness, or hardness, or you may feel a moveable soft area under your skin. An abscess can occur anywhere in the body. The infection may spread to surrounding tissues causing cellulitis. A cut (incision) by the surgeon was made over your abscess and the pus was drained out. Gauze may have been packed into the space to provide a drain that will allow the cavity to heal from the inside outwards. The boil may be painful for 5 to 7 days. Most people with a boil do not have high fevers. Your abscess, if seen early, may not have localized, and may not have been lanced. If not, another appointment may be required for this if it does not get better on its own or with medications. HOME CARE INSTRUCTIONS   Only take over-the-counter or prescription medicines for pain, discomfort, or fever as directed by your caregiver.  When you bathe, soak and then remove gauze or iodoform packs at least daily or as directed by your caregiver. You may then wash the wound gently with mild soapy water. Repack with gauze or do as your caregiver directs. SEEK IMMEDIATE MEDICAL CARE IF:   You develop increased pain, swelling, redness, drainage, or bleeding in the wound site.  You develop signs of generalized infection including muscle aches, chills, fever, or a general ill feeling.  An oral temperature above 102 F (38.9 C) develops, not controlled by medication. See your caregiver for a recheck if you develop any of the symptoms described above. If medications (antibiotics) were prescribed, take them as directed. Document Released: 07/13/2004 Document Revised: 03/19/2011 Document Reviewed:  03/10/2007 [facility] Patient Information 2014 [employer] [place].

## 2013-02-19 NOTE — ED Provider Notes (Signed)
CSN: [id number]     Arrival date & time 02/19/13  0911 History   First MD Initiated Contact with Patient 02/19/13 [phone]     Chief Complaint  Patient presents with  . Abscess     (Consider location/radiation/quality/duration/timing/severity/associated sxs/prior Treatment) HPI Amanda Villarreal is a 23 y.o. female who presents emergency department complaining of an abscess to the skin of the left flank. She states it's been there for several months but became tender painful and last few days. 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 She denies any fever, chills. She read denies any drainage. She denies any surrounding erythema or streaking. She denies any history of abscesses. She did not try any treatment for this.  Past Medical History  Diagnosis Date  . Bartholin's gland cyst 04/2011  . Anxiety    Past Surgical History  Procedure Laterality Date  . No past surgeries    . Urethral diverticulum repair  12/04/2011    Procedure: URETHRAL DIVERTICULUM REPAIR;  Surgeon: Martina SinnerScott A MacDiarmid, MD;  Location: [facility];  Service: Urology;  Laterality: N/A;  . Cystoscopy  12/04/2011    Procedure: CYSTOSCOPY;  Surgeon: Martina SinnerScott A MacDiarmid, MD;  Location: [facility];  Service: Urology;  Laterality: N/A;  . Ear cyst excision  12/04/2011    Procedure: CYST REMOVAL;  Surgeon: Martina SinnerScott A MacDiarmid, MD;  Location: [facility];  Service: Urology;  Laterality: N/A;   Family History  Problem Relation Age of Onset  . Hypertension Mother   . Diabetes Mother   . Fibromyalgia Mother   . Arthritis Mother   . Nephrolithiasis Mother   . Lung cancer Maternal Grandfather    History  Substance Use Topics  . Smoking status: Current Every Day Smoker -- 0.25 packs/day for 9 years    Types: Cigarettes  . Smokeless tobacco: Never Used  . Alcohol Use: No   OB History   Grav Para Term Preterm Abortions TAB SAB Ect Mult Living   2 1 1  1           Review of Systems  Constitutional: Negative for  fever and chills.  Skin: Positive for color change and wound.  Neurological: Negative for weakness.      Allergies  Review of patient's allergies indicates no known allergies.  Home Medications  No current outpatient prescriptions on file. BP 120/78  Pulse 82  Temp(Src) 98.5 F (36.9 C) (Oral)  Resp 20  SpO2 99% Physical Exam  Nursing note and vitals reviewed. Constitutional: She appears well-developed and well-nourished. No distress.  HENT:  Head: Normocephalic.  Eyes: Conjunctivae are normal.  Neck: Neck supple.  Cardiovascular: Normal rate.   Pulmonary/Chest: Effort normal. No respiratory distress. She has wheezes. She has no rales.  Musculoskeletal: She exhibits no edema.  Neurological: She is alert.  Skin: Skin is warm and dry.  Small approximately 2 cm area of swelling, induration, tenderness to palpation over the left flank. There is fluctuance. No surrounding erythema or tenderness.  Psychiatric: She has a normal mood and affect. Her behavior is normal.    ED Course  Procedures (including critical care time) Labs Review Labs Reviewed  WOUND CULTURE   Imaging Review No results found.  EKG Interpretation   None       MDM   Final diagnoses:  Abscess   INCISION AND DRAINAGE Performed by: Jaynie CrumbleKIRICHENKO, Dasan Hardman A Consent: Verbal consent obtained. Risks and benefits: risks, benefits and alternatives were discussed Type: abscess  Body area:  left flank   Anesthesia: local infiltration  Incision was made with a scalpel.  Local anesthetic: lidocaine 2% w epinephrine  Anesthetic total: 2 ml  Complexity: complex Blunt dissection to break up loculations  Drainage: purulent  Drainage amount: moderate  Packing material: no packing  Patient tolerance: Patient tolerated the procedure well with no immediate complications.    Patient with a very small abscess to the left flank. It was incised and drained. Culture sent by patient's request. Patient  is very worried this could be MRSA. There is no surrounding cellulitis or induration. I do not think patient needs to be on antibiotics. Careful wound care at home discussed. She is to followup with her primary care Dr. for recheck.  Filed Vitals:   02/19/13 0917  BP: 120/78  Pulse: 82  Temp: 98.5 F (36.9 C)  TempSrc: Oral  Resp: 20  SpO2: 99%     Amanda Murchison A Amay Mijangos, PA-C 02/19/13 1049

## 2013-02-21 LAB — WOUND CULTURE
Culture: NO GROWTH
SPECIAL REQUESTS: NORMAL

## 2013-05-26 ENCOUNTER — Encounter: Payer: Medicaid Other | Admitting: Family Medicine

## 2013-05-29 ENCOUNTER — Ambulatory Visit (INDEPENDENT_AMBULATORY_CARE_PROVIDER_SITE_OTHER): Payer: Medicaid Other | Admitting: Family Medicine

## 2013-05-29 ENCOUNTER — Encounter: Payer: Self-pay | Admitting: Family Medicine

## 2013-05-29 VITALS — BP 103/67 | HR 79 | Temp 98.6°F | Wt 188.0 lb

## 2013-05-29 DIAGNOSIS — F411 Generalized anxiety disorder: Secondary | ICD-10-CM

## 2013-05-29 MED ORDER — PAROXETINE HCL 20 MG PO TABS: 20.0000 mg | ORAL_TABLET | Freq: Every day | ORAL | Status: DC

## 2013-05-29 MED ORDER — ZOLPIDEM TARTRATE 5 MG PO TABS: 5.0000 mg | ORAL_TABLET | Freq: Every evening | ORAL | Status: DC | PRN

## 2013-05-29 NOTE — Patient Instructions (Addendum)
We are restarting your paxil for your generalized anxiety disorder. We are going to use ambien early to help you sleep but use sparingly as the plan is for this to be a 1x prescription. See me in 7-14 days.   Taking the medicine as directed and not missing any doses is one of the best things you can do to treat your depression.  Here are some things to keep in mind:  1) Side effects (stomach upset, some increased anxiety) may happen before you notice a benefit.  These side effects typically go away over time. 2) Changes to your dose of medicine or a change in medication all together is sometimes necessary 3) Most people need to be on medication at least 6-12 months 4) Many people will notice an improvement within two weeks but the full effect of the medication can take up to 4-6 weeks 5) Stopping the medication when you start feeling better often results in a return of symptoms 6) If you start having thoughts of hurting yourself or others after starting this medicine, please call me at [phone] immediately.    Health Maintenance Due  Topic Date Due  . Pap Smear - planned at [facility] 05/26/2006

## 2013-05-31 ENCOUNTER — Encounter: Payer: Self-pay | Admitting: Family Medicine

## 2013-05-31 NOTE — Assessment & Plan Note (Addendum)
GAD 7 today at 18 and rated as very difficult. Plan to restart paxil at this time and follow up in 1 week. Patient asks for Remus Loffler as it was very difficult for her to sleep when she first started paxil last time. I gave her a short term supply of this. I do not think this is a good long term plan given alcohol use and I also would prefer not to use xanax again. Think continued visits to counselor will be very helpful. Plan to follow up in 7-14 days with plan to titrate to 30 mg as that was the effective dose previously.

## 2013-05-31 NOTE — Progress Notes (Signed)
  Tana Conch, MD Phone: [phone]  Subjective:   Amanda Villarreal is a 23 y.o. year old very pleasant female patient who presents with the following:  Generalized Anxiety Disorder Patient with history of anxiety since age 72. She has generalized anxiety as well as history of panic attacks. Symptoms were previously well controlled on paxil but she stopped taking it about a year ago. She States she had a temporary period of depression when coming off medicine but today her phq 2 is 0. Symptoms started back up about 3 months ago. She has had to move and was recently separated from her husband. She states she cannot relax and is constantly in an anxious state. She has seen a therapist for 2 visitsand this has been somewhat helpful. Her GAD 7 scores at an 18 and rates symptoms as making her symptoms very difficult. She would like to restart paxil as it worked well for her.  ROS- No SI/HI. No hot or cold intolerance. No hair/skin/nail changes. No unintentional weight changes.   Past Medical History- generalized anxiety, insomnia, no history of other psychiatric illness. Never on any medicine but paxil and xanax at some point for anxiety. No thyroid disease history.  Family History- mother anxious but never on medication for anxiety, no history of psychiatric illness Social History- lives alone with daughter, smokes 1/2 ppd (not ready to quit), bottle of wine per week, no other drugs Medications- no meds prior to visit   Objective: BP 103/67  Pulse 79  Temp(Src) 98.6 F (37 C) (Oral)  Wt 188 lb (85.276 kg) Gen: NAD, resting comfortably in chair Neck: no thyromegaly CV: RRR no murmurs rubs or gallops Lungs: CTAB no crackles, wheeze, rhonchi Skin: warm, dry, no rash  Assessment/Plan:  Generalized anxiety disorder GAD 7 today at 16 and rated as very difficult. Plan to restart paxil at this time and follow up in 1 week. Patient asks for Remus Loffler as it was very difficult for her to sleep when  she first started paxil last time. I gave her a short term supply of this. I do not think this is a good long term plan given alcohol use and I also would prefer not to use xanax again. Think continued visits to counselor will be very helpful. Plan to follow up in 7-14 days with plan to titrate to 30 mg as that was the effective dose previously.    Meds ordered this encounter  Medications  . PARoxetine (PAXIL) 20 MG tablet    Sig: Take 1 tablet (20 mg total) by mouth daily.    Dispense:  30 tablet    Refill:  0  . zolpidem (AMBIEN) 5 MG tablet    Sig: Take 1 tablet (5 mg total) by mouth at bedtime as needed for sleep.    Dispense:  15 tablet    Refill:  0

## 2013-06-15 ENCOUNTER — Ambulatory Visit: Payer: Medicaid Other | Admitting: Family Medicine

## 2013-06-22 ENCOUNTER — Ambulatory Visit: Payer: Medicaid Other | Admitting: Family Medicine

## 2013-06-26 ENCOUNTER — Ambulatory Visit (INDEPENDENT_AMBULATORY_CARE_PROVIDER_SITE_OTHER): Payer: Medicaid Other | Admitting: Family Medicine

## 2013-06-26 ENCOUNTER — Encounter: Payer: Self-pay | Admitting: Family Medicine

## 2013-06-26 ENCOUNTER — Other Ambulatory Visit (HOSPITAL_COMMUNITY)
Admission: RE | Admit: 2013-06-26 | Discharge: 2013-06-26 | Disposition: A | Discharge status: Home / Self Care | Payer: Medicaid Other | Source: Ambulatory Visit | Attending: Family Medicine | Admitting: Family Medicine

## 2013-06-26 VITALS — BP 113/68 | HR 69 | Temp 98.2°F | Ht 65.0 in | Wt 186.6 lb

## 2013-06-26 DIAGNOSIS — Z01419 Encounter for gynecological examination (general) (routine) without abnormal findings: Secondary | ICD-10-CM | POA: Insufficient documentation

## 2013-06-26 DIAGNOSIS — Z113 Encounter for screening for infections with a predominantly sexual mode of transmission: Secondary | ICD-10-CM | POA: Diagnosis present

## 2013-06-26 LAB — HIV ANTIBODY (ROUTINE TESTING W REFLEX): HIV: NONREACTIVE

## 2013-06-26 LAB — RPR

## 2013-06-26 NOTE — Progress Notes (Signed)
GYNECOLOGY OFFICE NOTE  Chief Complaint:  Annual exam   Primary Care Physician: Tana ConchHUNTER, STEPHEN, MD  HPI:  Amanda Villarreal  Is a 23 yo female here for annual exam.   - mirena in place x 4 years - some spotting. No regular menses  Sexually active with one partner. Wants std screening  Exercises, smokes.   No fevers, chills, nausea, vomiting, cp, sob, ha, LE edema.    PMHx:  Past Medical History  Diagnosis Date  . Bartholin's gland cyst 04/2011  . Anxiety     Past Surgical History  Procedure Laterality Date  . No past surgeries    . Urethral diverticulum repair  12/04/2011    Procedure: URETHRAL DIVERTICULUM REPAIR;  Surgeon: Martina SinnerScott A MacDiarmid, MD;  Location: [facility];  Service: Urology;  Laterality: N/A;  . Cystoscopy  12/04/2011    Procedure: CYSTOSCOPY;  Surgeon: Martina SinnerScott A MacDiarmid, MD;  Location: [facility];  Service: Urology;  Laterality: N/A;  . Ear cyst excision  12/04/2011    Procedure: CYST REMOVAL;  Surgeon: Martina SinnerScott A MacDiarmid, MD;  Location: [facility];  Service: Urology;  Laterality: N/A;    FAMHx:  Family History  Problem Relation Age of Onset  . Hypertension Mother   . Diabetes Mother   . Fibromyalgia Mother   . Arthritis Mother   . Nephrolithiasis Mother   . Lung cancer Maternal Grandfather     SOCHx:   reports that she has been smoking Cigarettes.  She has a 2.25 pack-year smoking history. She has never used smokeless tobacco. She reports that she drinks alcohol. She reports that she does not use illicit drugs.  ALLERGIES:  No Known Allergies  ROS: Pertinent ROS as seen in HPI. Otherwise negative.   HOME MEDS: Current Outpatient Prescriptions  Medication Sig Dispense Refill  . PARoxetine (PAXIL) 20 MG tablet Take 1 tablet (20 mg total) by mouth daily.  30 tablet  0  . zolpidem (AMBIEN) 5 MG tablet Take 1 tablet (5 mg total) by mouth at bedtime as needed for sleep.  15 tablet  0   No current facility-administered medications for this visit.     LABS/IMAGING: No results found for this or any previous visit (from the past 48 hour(s)). No results found.  VITALS: BP 113/68  Pulse 69  Temp(Src) 98.2 F (36.8 C) (Oral)  Ht 5\' 5"  (1.651 m)  Wt 186 lb 9.6 oz (84.641 kg)  BMI 31.05 kg/m2  EXAM: GENERAL: Well-developed, well-nourished female in no acute distress.  HEENT: Normocephalic, atraumatic. Sclerae anicteric.  NECK: Supple. Normal thyroid.  LUNGS: Clear to auscultation bilaterally.  HEART: Regular rate and rhythm. BREASTS: L>R. No masses, skin changes, nipple drainage, or lymphadenopathy. ABDOMEN: Soft, nontender, nondistended. No organomegaly. PELVIC: Normal external female genitalia. Vagina is pink and rugated.  Normal discharge. Normal cervix contour. Pap smear obtained. Uterus is normal in size. No adnexal mass or tenderness.  EXTREMITIES: No cyanosis, clubbing, or edema, 2+ distal pulses.   ASSESSMENT: Routine gynecological examination - Plan: Cytology - PAP, HIV antibody (with reflex), RPR  PLAN:  - PAP done today  - STD testing as per pt request and USPSTF guidelines - urged to quit smoking. quitline information given today - cont exercising  - f/u in 1 year and will need IUD replaced at that time (5 years)   BECK, Redmond BasemanKELI L, MD

## 2013-06-26 NOTE — Patient Instructions (Signed)
Levonorgestrel intrauterine device (IUD) What is this medicine? LEVONORGESTREL IUD (LEE voe nor jes trel) is a contraceptive (birth control) device. The device is placed inside the uterus by a healthcare professional. It is used to prevent pregnancy and can also be used to treat heavy bleeding that occurs during your period. Depending on the device, it can be used for 3 to 5 years. This medicine may be used for other purposes; ask your health care provider or pharmacist if you have questions. COMMON BRAND NAME(S): Mirena, Skyla What should I tell my health care provider before I take this medicine? They need to know if you have any of these conditions: -abnormal Pap smear -cancer of the breast, uterus, or cervix -diabetes -endometritis -genital or pelvic infection now or in the past -have more than one sexual partner or your partner has more than one partner -heart disease -history of an ectopic or tubal pregnancy -immune system problems -IUD in place -liver disease or tumor -problems with blood clots or take blood-thinners -use intravenous drugs -uterus of unusual shape -vaginal bleeding that has not been explained -an unusual or allergic reaction to levonorgestrel, other hormones, silicone, or polyethylene, medicines, foods, dyes, or preservatives -pregnant or trying to get pregnant -breast-feeding How should I use this medicine? This device is placed inside the uterus by a health care professional. Talk to your pediatrician regarding the use of this medicine in children. Special care may be needed. Overdosage: If you think you have taken too much of this medicine contact a poison control center or emergency room at once. NOTE: This medicine is only for you. Do not share this medicine with others. What if I miss a dose? This does not apply. What may interact with this medicine? Do not take this medicine with any of the following  medications: -amprenavir -bosentan -fosamprenavir This medicine may also interact with the following medications: -aprepitant -barbiturate medicines for inducing sleep or treating seizures -bexarotene -griseofulvin -medicines to treat seizures like carbamazepine, ethotoin, felbamate, oxcarbazepine, phenytoin, topiramate -modafinil -pioglitazone -rifabutin -rifampin -rifapentine -some medicines to treat HIV infection like atazanavir, indinavir, lopinavir, nelfinavir, tipranavir, ritonavir -St. John's wort -warfarin This list may not describe all possible interactions. Give your health care provider a list of all the medicines, herbs, non-prescription drugs, or dietary supplements you use. Also tell them if you smoke, drink alcohol, or use illegal drugs. Some items may interact with your medicine. What should I watch for while using this medicine? Visit your doctor or health care professional for regular check ups. See your doctor if you or your partner has sexual contact with others, becomes HIV positive, or gets a sexual transmitted disease. This product does not protect you against HIV infection (AIDS) or other sexually transmitted diseases. You can check the placement of the IUD yourself by reaching up to the top of your vagina with clean fingers to feel the threads. Do not pull on the threads. It is a good habit to check placement after each menstrual period. Call your doctor right away if you feel more of the IUD than just the threads or if you cannot feel the threads at all. The IUD may come out by itself. You may become pregnant if the device comes out. If you notice that the IUD has come out use a backup birth control method like condoms and call your health care provider. Using tampons will not change the position of the IUD and are okay to use during your period. What side effects may I   notice from receiving this medicine? Side effects that you should report to your doctor or  health care professional as soon as possible: -allergic reactions like skin rash, itching or hives, swelling of the face, lips, or tongue -fever, flu-like symptoms -genital sores -high blood pressure -no menstrual period for 6 weeks during use -pain, swelling, warmth in the leg -pelvic pain or tenderness -severe or sudden headache -signs of pregnancy -stomach cramping -sudden shortness of breath -trouble with balance, talking, or walking -unusual vaginal bleeding, discharge -yellowing of the eyes or skin Side effects that usually do not require medical attention (report to your doctor or health care professional if they continue or are bothersome): -acne -breast pain -change in sex drive or performance -changes in weight -cramping, dizziness, or faintness while the device is being inserted -headache -irregular menstrual bleeding within first 3 to 6 months of use -nausea This list may not describe all possible side effects. Call your doctor for medical advice about side effects. You may report side effects to FDA at 1-800-FDA-1088. Where should I keep my medicine? This does not apply. NOTE: This sheet is a summary. It may not cover all possible information. If you have questions about this medicine, talk to your doctor, pharmacist, or health care provider.  2015, Elsevier/[employer] Standard. (2011-01-25 13:54:04)  

## 2013-06-29 LAB — CYTOLOGY - PAP

## 2013-07-20 ENCOUNTER — Encounter: Payer: Self-pay | Admitting: Family Medicine

## 2013-07-20 ENCOUNTER — Ambulatory Visit (INDEPENDENT_AMBULATORY_CARE_PROVIDER_SITE_OTHER): Payer: Medicaid Other | Admitting: Family Medicine

## 2013-07-20 VITALS — BP 135/77 | HR 92 | Temp 98.2°F | Ht 65.0 in | Wt 190.5 lb

## 2013-07-20 DIAGNOSIS — F411 Generalized anxiety disorder: Secondary | ICD-10-CM

## 2013-07-20 MED ORDER — SERTRALINE HCL 50 MG PO TABS: 50.0000 mg | ORAL_TABLET | Freq: Every day | ORAL | Status: DC

## 2013-07-20 NOTE — Patient Instructions (Signed)
Thank you for coming to clinic today. You were seen for follow up for your anxiety and depression. We decided to start you on a new medication, Zoloft, instead of paxil because you were not seeing much change. If it is abnormal, we will contact you prior to your follow up appointment. Please see me again in 1 month for follow up. We are also obtaining a TSH to check your thyroid.

## 2013-07-20 NOTE — Progress Notes (Signed)
Patient ID: Amanda Villarreal, female   DOB: 1990-02-14, 23 y.o.   MRN: [id number] I have reviewed this encounter and agree with the assessment and plan as documented by the resident.   Donnella ShamKyle Dryden Tapley MD

## 2013-07-20 NOTE — Assessment & Plan Note (Addendum)
Doing about the same as last visit. Interested in changing medications today. GAD7 is 15 with very difficult to get things done. I also suspect her anxiety is contributing to her insomnia and inability to focus at work. Will check TSH today. Start sertraline 50mg  qd, stop paxil. Follow up in one month.

## 2013-07-20 NOTE — Progress Notes (Signed)
Patient ID: Amanda Villarreal, female   DOB: 1990-08-17, 23 y.o.   MRN: [id number]    Amanda Villarreal is a 23 y.o. female [facility]   HPI:  Patient has a prolonged history of GAD an panic attacks starting when she was 23 years old. She was initally prescribed paxil back, which was helpful and she was eventually able to come off. Recently divorced 6 months ago, with recurrence of her anxiety symptoms in addition to depression. Started paxil again 2 months ago, but the paxil has not had the same effect it had when she was younger. Says that her depression symptoms have resolved some, but she still has trouble focusing and sleeping. Has difficulty sleeping due to racing thoughts. PHQ9 today was 10 with her symptoms making life very difficult and her GAD7 was 15 with her symptoms making life very difficult. Of note, her GAD7 score was 18 during her visit 2 months ago. She was drinking a glass of wine each night to help with her anxiety, but has stopped over the last month. Endorses irritability, restlessness, insomnia, poor appetite, and trouble concentrating. Patient is concerned that she may have ADHD.  Has seen 2 therapists in the past with minimal benefit, however plans on seeing [facility] soon.   Patient is adamant about starting a new medication instead of increasing the dose of paxil, as she feels like it really has not helped that much.  History Reviewed: Current smoker. Thought about quitting, but not ready yet  Health Maintenance: Pap smear last month. Up to date on vaccinations and STI screening.  Past Medical History  Diagnosis Date  . Bartholin's gland cyst 04/2011  . Anxiety    ROS as above.    Medications reviewed. Current Outpatient Prescriptions  Medication Sig Dispense Refill  . sertraline (ZOLOFT) 50 MG tablet Take 1 tablet (50 mg total) by mouth daily.  30 tablet  3  . zolpidem (AMBIEN) 5 MG tablet Take 1 tablet (5 mg total) by mouth at  bedtime as needed for sleep.  15 tablet  0   No current facility-administered medications for this visit.    Exam:  BP 135/77  Pulse 92  Temp(Src) 98.2 F (36.8 C) (Oral)  Ht 5\' 5"  (1.651 m)  Wt 86.41 kg (190 lb 8 oz)  BMI 31.70 kg/m2 Gen: Well NAD HEENT: EOMI,  MMM Lungs: CTABL Nl WOB Heart: RRR no MRG Abd: NABS, NT, ND Exts: Non edematous BL  LE, warm and well perfused.   No results found for this or any previous visit (from the past 72 hour(s)).  A/P: See problem list  Generalized anxiety disorder Doing about the same as last visit. Interested in changing medications today. GAD7 is 15 with very difficult to get things done. I also suspect her anxiety is contributing to her insomnia and inability to focus at work. Will check TSH today. Start sertraline 50mg  qd, stop paxil. Follow up in one month.    Katina Degreealeb M. Jimmey RalphParker, MD [facility] Resident PGY-1 07/20/2013 3:23 PM

## 2013-07-21 LAB — TSH: TSH: 2.628 u[IU]/mL (ref 0.350–4.500)

## 2013-11-09 ENCOUNTER — Encounter: Payer: Self-pay | Admitting: Family Medicine

## 2014-01-04 ENCOUNTER — Encounter: Payer: Self-pay | Admitting: Family Medicine

## 2014-01-04 ENCOUNTER — Ambulatory Visit (INDEPENDENT_AMBULATORY_CARE_PROVIDER_SITE_OTHER): Payer: Medicaid Other | Admitting: Family Medicine

## 2014-01-04 VITALS — BP 123/86 | HR 111 | Temp 99.2°F | Ht 64.0 in | Wt 202.2 lb

## 2014-01-04 DIAGNOSIS — R102 Pelvic and perineal pain: Secondary | ICD-10-CM | POA: Insufficient documentation

## 2014-01-04 LAB — POCT UA - MICROSCOPIC ONLY

## 2014-01-04 LAB — POCT URINALYSIS DIPSTICK
BILIRUBIN UA: NEGATIVE
Glucose, UA: NEGATIVE
KETONES UA: NEGATIVE
LEUKOCYTES UA: NEGATIVE
Nitrite, UA: NEGATIVE
PH UA: 7
PROTEIN UA: NEGATIVE
SPEC GRAV UA: 1.02
Urobilinogen, UA: 0.2

## 2014-01-04 LAB — CBC
HCT: 40.8 % (ref 36.0–46.0)
HEMOGLOBIN: 13.1 g/dL (ref 12.0–15.0)
MCH: 30.9 pg (ref 26.0–34.0)
MCHC: 32.1 g/dL (ref 30.0–36.0)
MCV: 96.2 fL (ref 78.0–100.0)
MPV: 10.2 fL (ref 9.4–12.4)
PLATELETS: 321 10*3/uL (ref 150–400)
RBC: 4.24 MIL/uL (ref 3.87–5.11)
RDW: 12.8 % (ref 11.5–15.5)
WBC: 9.6 10*3/uL (ref 4.0–10.5)

## 2014-01-04 LAB — POCT URINE PREGNANCY: PREG TEST UR: NEGATIVE

## 2014-01-04 MED ORDER — MELOXICAM 7.5 MG PO TABS: 7.5000 mg | ORAL_TABLET | Freq: Every day | ORAL | Status: AC

## 2014-01-04 NOTE — Progress Notes (Signed)
Subjective: Amanda Villarreal is a 23 y.o. G1P1 presenting for pelvic pain.  3 months of intermittent sharp lower abdominal/pelvic pain in the midline. Began every few weeks and has increased in frequency to nearly everyday now. Feels somewhat like a contraction and is sometimes induced after sexual intercourse. Also tends to radiate up to the RUQ abdomen. Light spotting has been present over the past mont but is no change - has had irregular spotting since Mirena insertion 4 years ago. No fevers, vaginal discharge or irritation, dysuria, urinary frequency or urgency. no nausea, vomiting, constipation, diarrhea. No heartburn. Indigestion occurs when eating late at night.  Sexually active with single female, her boyfriend, for the past > 1 year. He has no symptoms and she had a gynecologic check up 2 months ago with negative STD panel and pap smear. Boyfriend has no symptoms. Had 1 child via uncomplicated NSVD. No abdominal or pelvic surgeries.   Had terrible gas pains at night relieved by large burp. twice in the past 2 months. It is severe.   - Review of Systems: Per HPI.   Objective: BP 123/86 mmHg  Pulse 111  Temp(Src) 99.2 F (37.3 C) (Oral)  Ht 5\' 4"  (1.626 m)  Wt 202 lb 3.2 oz (91.717 kg)  BMI 34.69 kg/m2 Gen:  23 y.o. female in no distress GI: Normoactive BS; striae present, soft, non-tender, non-distended, mild suprapubic tenderness Pelvic: Deferred per pt preference  Assessment/Plan: Amanda Villarreal is a 23 y.o. female here for pelvic pain  1. Pelvic pain in female Will check for inflammatory/infectious markers and pelvic U/S. Trial NSAID in case this is uterine cramping. Doubt IUD perforation. Defer cultures, etc. given recently negative infectious work up and no new exposures.  - Urine pregnancy test - Urinalysis - CBC - Sedimentation Rate - US Pelvis Complete; Future - meloxicam (MOBIC) 7.5 MG tablet; Take 1 tablet (7.5 mg total) by mouth daily.  Dispense: 30 tablet; Refill:  0

## 2014-01-04 NOTE — Patient Instructions (Signed)
It was nice to meet you!  We're going to try to treat and diagnose this pain at the same time. I don't think it's just your gallbladder.  - Take mobic once a day for the next 1-2 weeks to see if this helps your pain.  - [facility]  - We will call you with the results of the blood work here.  - Please call to let us know if things get worse. You should come in immediately if you develop a fever.

## 2014-01-05 LAB — SEDIMENTATION RATE: Sed Rate: 4 mm/hr (ref 0–22)

## 2014-01-12 ENCOUNTER — Ambulatory Visit (HOSPITAL_COMMUNITY)
Admission: RE | Admit: 2014-01-12 | Discharge: 2014-01-12 | Disposition: A | Discharge status: Home / Self Care | Payer: Medicaid Other | Source: Ambulatory Visit | Attending: Family Medicine | Admitting: Family Medicine

## 2014-01-12 ENCOUNTER — Other Ambulatory Visit: Payer: Self-pay | Admitting: Family Medicine

## 2014-01-12 DIAGNOSIS — N949 Unspecified condition associated with female genital organs and menstrual cycle: Secondary | ICD-10-CM | POA: Diagnosis not present

## 2014-01-12 DIAGNOSIS — R102 Pelvic and perineal pain: Secondary | ICD-10-CM

## 2014-01-12 DIAGNOSIS — Z975 Presence of (intrauterine) contraceptive device: Secondary | ICD-10-CM | POA: Insufficient documentation

## 2014-01-13 ENCOUNTER — Ambulatory Visit (HOSPITAL_COMMUNITY): Payer: Medicaid Other

## 2014-01-13 ENCOUNTER — Encounter: Payer: Self-pay | Admitting: Family Medicine

## 2014-02-19 ENCOUNTER — Ambulatory Visit: Payer: Medicaid Other | Admitting: Family Medicine

## 2014-10-06 ENCOUNTER — Ambulatory Visit: Payer: Medicaid Other | Admitting: Obstetrics & Gynecology

## 2015-04-20 ENCOUNTER — Ambulatory Visit (INDEPENDENT_AMBULATORY_CARE_PROVIDER_SITE_OTHER): Payer: Commercial Managed Care - HMO | Admitting: Obstetrics & Gynecology

## 2015-04-20 ENCOUNTER — Encounter: Payer: Self-pay | Admitting: Obstetrics & Gynecology

## 2015-04-20 VITALS — BP 111/66 | HR 66 | Ht 66.0 in | Wt 174.2 lb

## 2015-04-20 DIAGNOSIS — Z3042 Encounter for surveillance of injectable contraceptive: Secondary | ICD-10-CM | POA: Diagnosis not present

## 2015-04-20 DIAGNOSIS — Z30013 Encounter for initial prescription of injectable contraceptive: Secondary | ICD-10-CM

## 2015-04-20 DIAGNOSIS — Z30432 Encounter for removal of intrauterine contraceptive device: Secondary | ICD-10-CM | POA: Diagnosis not present

## 2015-04-20 MED ORDER — MEDROXYPROGESTERONE ACETATE 150 MG/ML IM SUSP
150.0000 mg | INTRAMUSCULAR | Status: AC
  Administered 2015-04-20: 150 mg via INTRAMUSCULAR

## 2015-04-20 NOTE — Progress Notes (Signed)
    GYNECOLOGY CLINIC PROCEDURE NOTE  Amanda Villarreal is a 25 y.o. G2P1011 here for Mirena IUD removal. No GYN concerns.  Last pap smear was on 06/26/2013 and was normal.  IUD Removal  Patient identified, informed consent performed, consent signed.  Patient was in the dorsal lithotomy position, normal external genitalia was noted.  A speculum was placed in the patient's vagina, normal discharge was noted, no lesions. The cervix was visualized, no lesions, no abnormal discharge.  The strings of the IUD were grasped and pulled using ring forceps. The IUD was removed in its entirety.  Patient tolerated the procedure well.    Patient will use Depo provera for contraception.   Routine preventative health maintenance measures emphasized.   Jaynie CollinsUGONNA  Elena Cothern, MD, FACOG Attending Obstetrician & Gynecologist, [facility]

## 2015-04-20 NOTE — Patient Instructions (Signed)
Return to clinic for any scheduled appointments or for any gynecologic concerns as needed.   

## 2015-07-04 IMAGING — US US TRANSVAGINAL NON-OB
1 series · 14 of 25 positions shown · non-contrast
Comparison: Pelvic MRI 09/29/2011

CLINICAL DATA: Generalized pelvic pain for 2 months.

EXAM:
TRANSABDOMINAL AND TRANSVAGINAL ULTRASOUND OF PELVIS
TECHNIQUE: Both transabdominal and transvaginal ultrasound examinations of the
pelvis were performed. Transabdominal technique was performed for
global imaging of the pelvis including uterus, ovaries, adnexal
regions, and pelvic cul-de-sac. It was necessary to proceed with
endovaginal exam following the transabdominal exam to visualize the
endometrium and ovaries.

[Series 1: us pelvis complete · 14 of 39 slices shown]
[im 1/39]
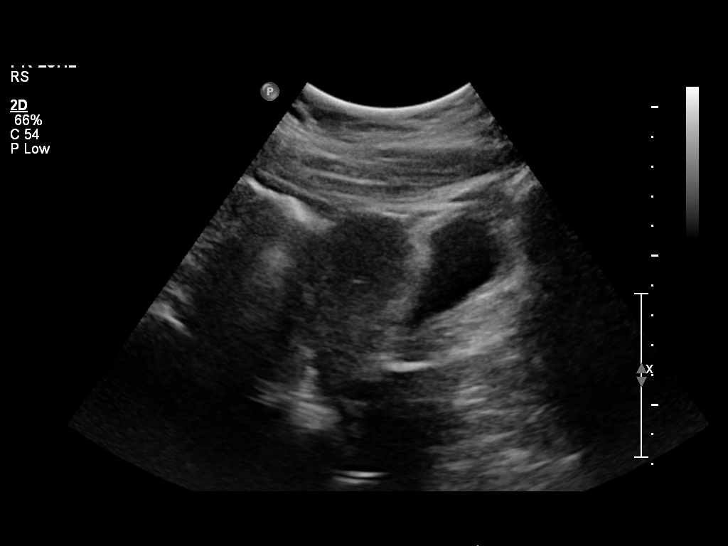
[im 4/39]
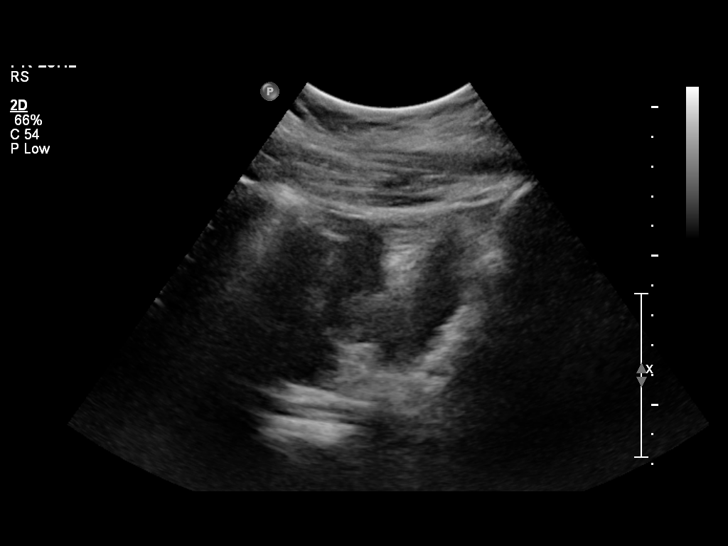
[im 7/39]
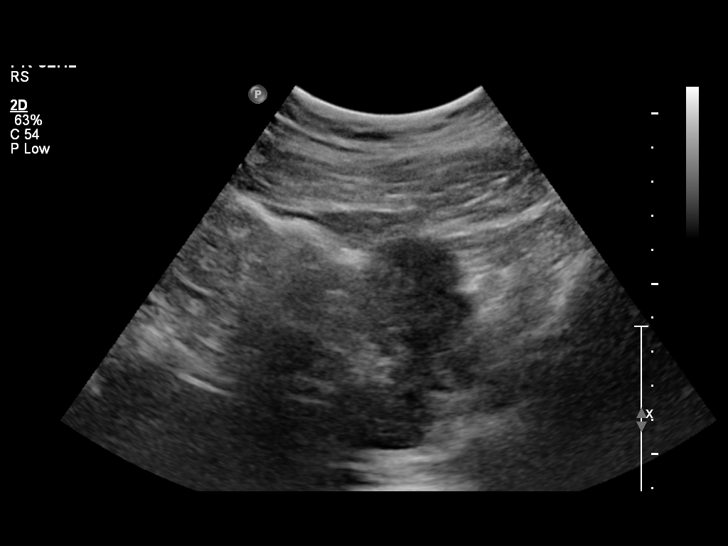
[im 10/39]
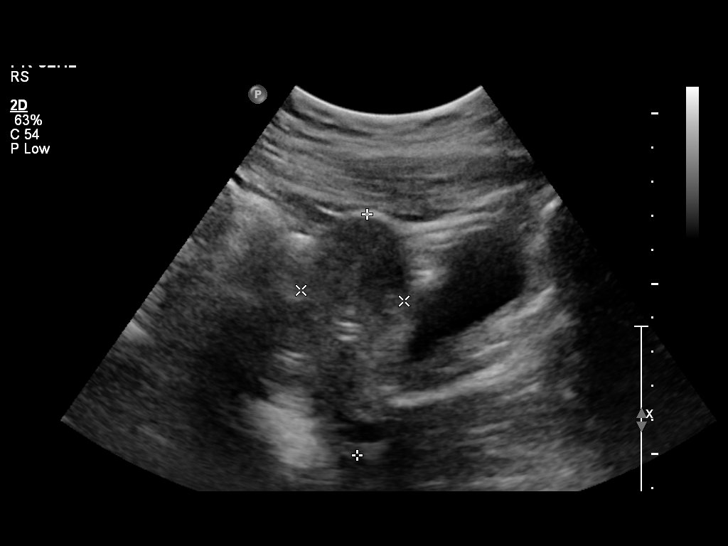
[im 13/39]
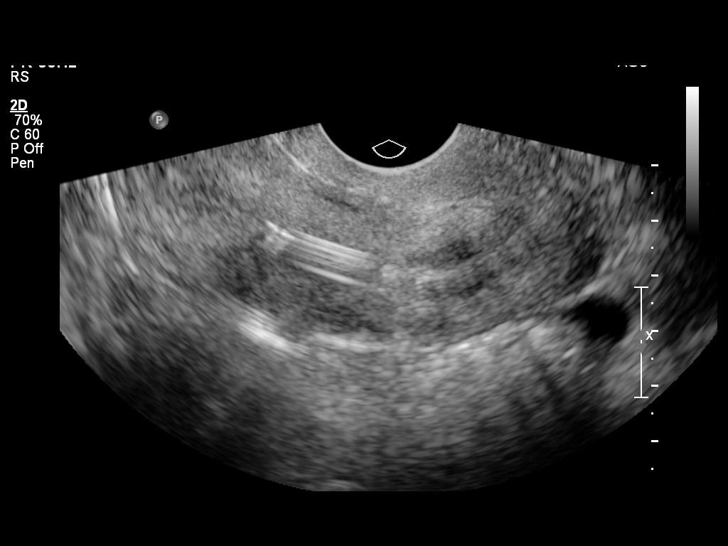
[im 15/39]
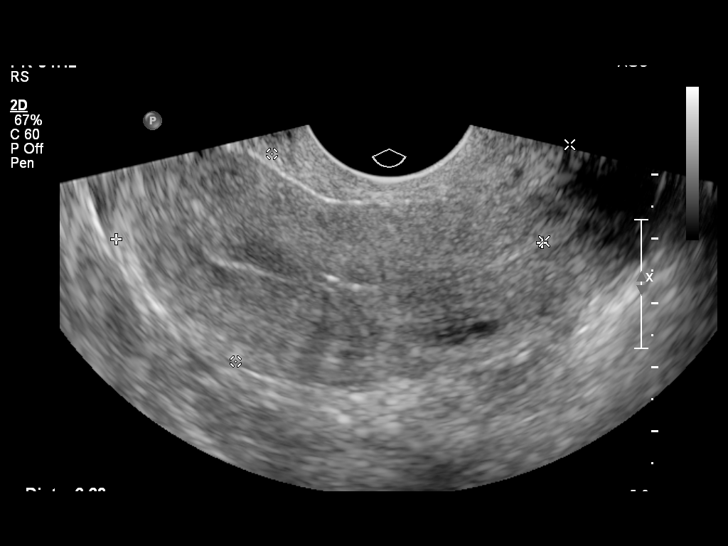
[im 18/39]
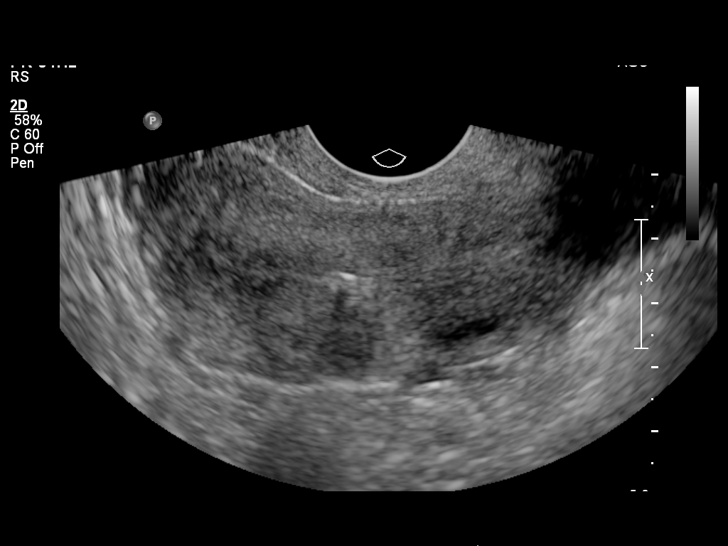
[im 21/39]
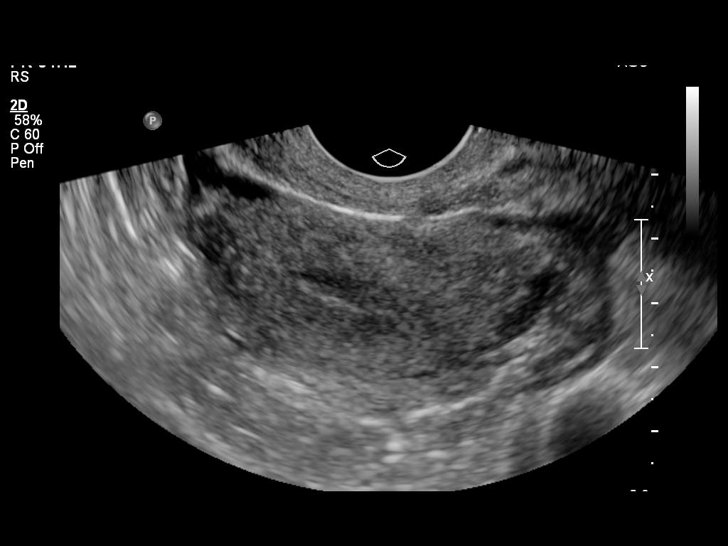
[im 24/39]
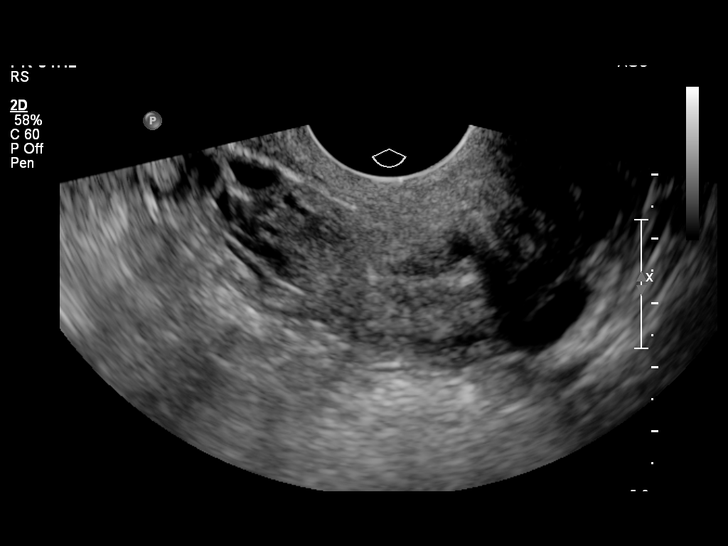
[im 26/39]
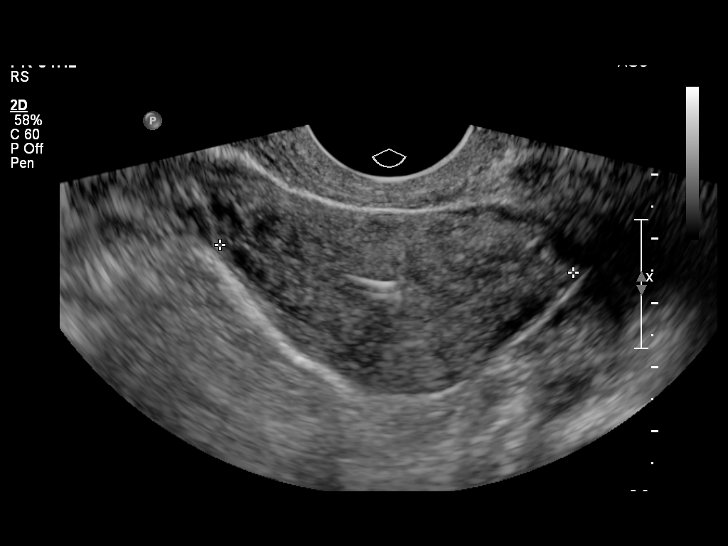
[im 29/39]
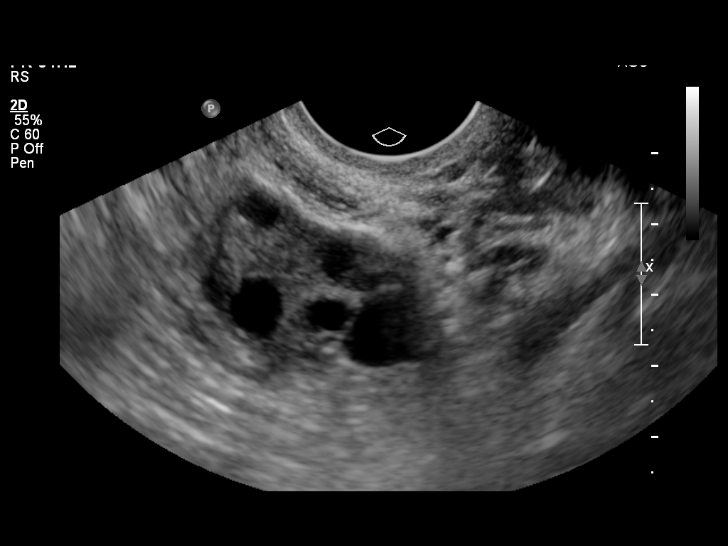
[im 32/39]
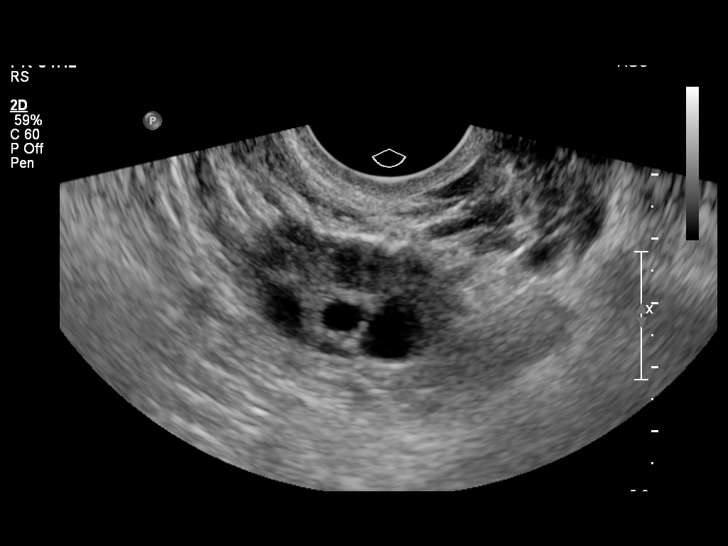
[im 35/39]
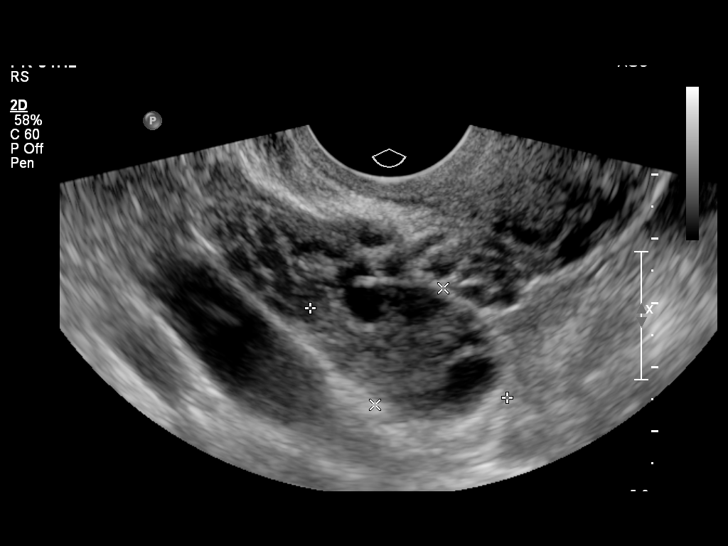
[im 39/39]
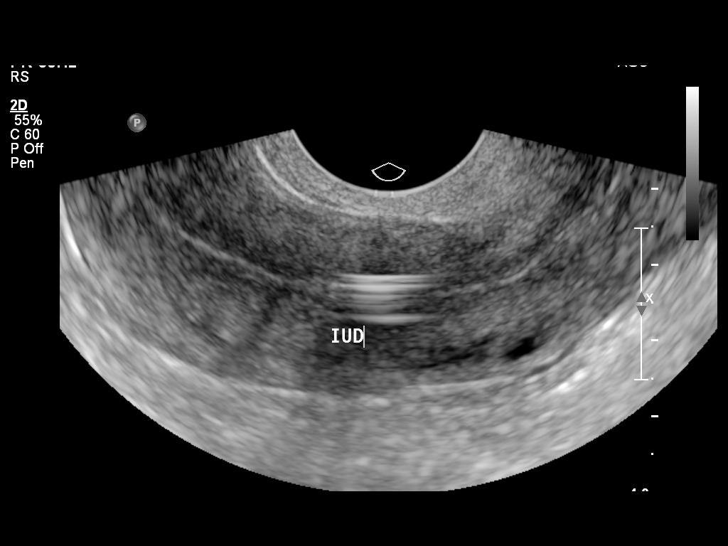

[14 of 25 positions shown; findings below may reference images not displayed]

FINDINGS: Uterus

Measurements: 8.2 x 3.3 x 5.5 cm. No fibroids or other mass
visualized.

Endometrium

Thickness: 3 mm in thickness. IUD noted in place. No focal
abnormality visualized.

Right ovary

Measurements: 3.5 x 1.8 x 3.4 cm. Normal appearance/no adnexal mass.

Left ovary

Measurements: 3.4 x 2.1 x 2.4 cm. Normal appearance/no adnexal mass.

Other findings

No free fluid.
IMPRESSION: IUD in place.  No acute or significant abnormality.

## 2015-12-19 ENCOUNTER — Ambulatory Visit: Payer: Commercial Managed Care - HMO | Admitting: Obstetrics & Gynecology
